# Patient Record
Sex: Female | Born: 1995 | Race: White | Hispanic: No | Marital: Married | Smoking: Never smoker
Health system: Southern US, Community
[De-identification: ages and names within clinical notes are randomized; demographics above are authoritative.]

## PROBLEM LIST (undated history)

## (undated) DIAGNOSIS — F329 Major depressive disorder, single episode, unspecified: Secondary | ICD-10-CM

## (undated) DIAGNOSIS — F32A Depression, unspecified: Secondary | ICD-10-CM

## (undated) DIAGNOSIS — F419 Anxiety disorder, unspecified: Secondary | ICD-10-CM

## (undated) DIAGNOSIS — T7840XA Allergy, unspecified, initial encounter: Secondary | ICD-10-CM

## (undated) HISTORY — DX: Allergy, unspecified, initial encounter: T78.40XA

## (undated) HISTORY — DX: Depression, unspecified: F32.A

## (undated) HISTORY — DX: Anxiety disorder, unspecified: F41.9

## (undated) HISTORY — PX: NO PAST SURGERIES: SHX2092

---
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
## 2018-08-15 ENCOUNTER — Ambulatory Visit: Payer: Self-pay | Admitting: Adult Health

## 2018-08-15 ENCOUNTER — Encounter: Payer: Self-pay | Admitting: Adult Health

## 2018-08-15 VITALS — BP 131/87 | HR 71 | Temp 98.5°F | Resp 16 | Ht 66.0 in | Wt 124.0 lb

## 2018-08-15 DIAGNOSIS — H00024 Hordeolum internum left upper eyelid: Secondary | ICD-10-CM

## 2018-08-15 DIAGNOSIS — L819 Disorder of pigmentation, unspecified: Secondary | ICD-10-CM

## 2018-08-15 DIAGNOSIS — H0014 Chalazion left upper eyelid: Secondary | ICD-10-CM

## 2018-08-15 MED ORDER — ERYTHROMYCIN 5 MG/GM OP OINT: 1.0000 "application " | TOPICAL_OINTMENT | Freq: Three times a day (TID) | OPHTHALMIC | 0 refills | Status: AC

## 2018-08-15 NOTE — Patient Instructions (Addendum)
Stye A stye is a bump on your eyelid caused by a bacterial infection. A stye can form inside the eyelid (internal stye) or outside the eyelid (external stye). An internal stye may be caused by an infected oil-producing gland inside your eyelid. An external stye may be caused by an infection at the base of your eyelash (hair follicle). Styes are very common. Anyone can get them at any age. They usually occur in just one eye, but you may have more than one in either eye. What are the causes? The infection is almost always caused by bacteria called Staphylococcus aureus. This is a common type of bacteria that lives on your skin. What increases the risk? You may be at higher risk for a stye if you have had one before. You may also be at higher risk if you have:  Diabetes.  Long-term illness.  Long-term eye redness.  A skin condition called seborrhea.  High fat levels in your blood (lipids).  What are the signs or symptoms? Eyelid pain is the most common symptom of a stye. Internal styes are more painful than external styes. Other signs and symptoms may include:  Painful swelling of your eyelid.  A scratchy feeling in your eye.  Tearing and redness of your eye.  Pus draining from the stye.  How is this diagnosed? Your health care provider may be able to diagnose a stye just by examining your eye. The health care provider may also check to make sure:  You do not have a fever or other signs of a more serious infection.  The infection has not spread to other parts of your eye or areas around your eye.  How is this treated? Most styes will clear up in a few days without treatment. In some cases, you may need to use antibiotic drops or ointment to prevent infection. Your health care provider may have to drain the stye surgically if your stye is:  Large.  Causing a lot of pain.  Interfering with your vision.  This can be done using a thin blade or a needle. Follow these  instructions at home:  Take medicines only as directed by your health care provider.  Apply a clean, warm compress to your eye for 10 minutes, 4 times a day.  Do not wear contact lenses or eye makeup until your stye has healed.  Do not try to pop or drain the stye. Contact a health care provider if:  You have chills or a fever.  Your stye does not go away after several days.  Your stye affects your vision.  Your eyeball becomes swollen, red, or painful. This information is not intended to replace advice given to you by your health care provider. Make sure you discuss any questions you have with your health care provider. Document Released: 08/01/2005 Document Revised: 06/17/2016 Document Reviewed: 02/05/2014 Elsevier Interactive Patient Education  2018 [employer]. Chalazion A chalazion is a swelling or lump on the eyelid. It can affect the upper or lower eyelid. What are the causes? This condition may be caused by:  Long-lasting (chronic) inflammation of the eyelid glands.  A blocked oil gland in the eyelid.  What are the signs or symptoms? Symptoms of this condition include:  A swelling on the eyelid. The swelling may spread to areas around the eye.  A hard lump on the eyelid. This lump may make it hard to see out of the eye.  How is this diagnosed? This condition is diagnosed with an  examination of the eye. How is this treated? This condition is treated by applying a warm compress to the eyelid. If the condition does not improve after two days, it may be treated with:  Surgery.  Medicine that is injected into the chalazion by a health care provider.  Medicine that is applied to the eye.  Follow these instructions at home:  Do not touch the chalazion.  Do not try to remove the pus, such as by squeezing the chalazion or sticking it with a pin or needle.  Do not rub your eyes.  Wash your hands often. Dry your hands with a clean towel.  Keep your face,  scalp, and eyebrows clean.  Avoid wearing eye makeup.  Apply a warm, moist compress to the eyelid 4-6 times a day for 10-15 minutes at a time. This will help to open any blocked glands and help to reduce redness and swelling.  Apply over-the-counter and prescription medicines only as told by your health care provider.  If the chalazion does not break open (rupture) on its own in a month, return to your health care provider.  Keep all follow-up appointments as told by your health care provider. This is important. Contact a health care provider if:  Your eyelid has not improved in 4 weeks.  Your eyelid is getting worse.  You have a fever.  The chalazion does not rupture on its own with home treatment in a month. Get help right away if:  You have pain in your eye.  Your vision changes.  The chalazion becomes painful or red  The chalazion gets bigger. This information is not intended to replace advice given to you by your health care provider. Make sure you discuss any questions you have with your health care provider. Document Released: 10/19/2000 Document Revised: 03/29/2016 Document Reviewed: 02/14/2015 Elsevier Interactive Patient Education  2018 [employer] Stye A stye is a bump on your eyelid caused by a bacterial infection. A stye can form inside the eyelid (internal stye) or outside the eyelid (external stye). An internal stye may be caused by an infected oil-producing gland inside your eyelid. An external stye may be caused by an infection at the base of your eyelash (hair follicle). Styes are very common. Anyone can get them at any age. They usually occur in just one eye, but you may have more than one in either eye. What are the causes? The infection is almost always caused by bacteria called Staphylococcus aureus. This is a common type of bacteria that lives on your skin. What increases the risk? You may be at higher risk for a stye if you have had one before. You may  also be at higher risk if you have:  Diabetes.  Long-term illness.  Long-term eye redness.  A skin condition called seborrhea.  High fat levels in your blood (lipids).  What are the signs or symptoms? Eyelid pain is the most common symptom of a stye. Internal styes are more painful than external styes. Other signs and symptoms may include:  Painful swelling of your eyelid.  A scratchy feeling in your eye.  Tearing and redness of your eye.  Pus draining from the stye.  How is this diagnosed? Your health care provider may be able to diagnose a stye just by examining your eye. The health care provider may also check to make sure:  You do not have a fever or other signs of a more serious infection.  The infection has not spread to  other parts of your eye or areas around your eye.  How is this treated? Most styes will clear up in a few days without treatment. In some cases, you may need to use antibiotic drops or ointment to prevent infection. Your health care provider may have to drain the stye surgically if your stye is:  Large.  Causing a lot of pain.  Interfering with your vision.  This can be done using a thin blade or a needle. Follow these instructions at home:  Take medicines only as directed by your health care provider.  Apply a clean, warm compress to your eye for 10 minutes, 4 times a day.  Do not wear contact lenses or eye makeup until your stye has healed.  Do not try to pop or drain the stye. Contact a health care provider if:  You have chills or a fever.  Your stye does not go away after several days.  Your stye affects your vision.  Your eyeball becomes swollen, red, or painful. This information is not intended to replace advice given to you by your health care provider. Make sure you discuss any questions you have with your health care provider. Document Released: 08/01/2005 Document Revised: 06/17/2016 Document Reviewed: 02/05/2014 Elsevier  Interactive Patient Education  [employer].

## 2018-08-15 NOTE — Progress Notes (Addendum)
Subjective:     Patient ID: Heidi Gonzales, female   DOB: Feb 27, 1996, 22 y.o.   MRN: [id number]  HPI   Blood pressure 131/87, pulse 71, temperature 98.5 F (36.9 C), resp. rate 16, height 5\' 6"  (1.676 m), weight 124 lb (56.2 kg), SpO2 100 %. Patient is a 22 year old female in no acute distress who comes to the clinic for upper eye lid swelling. Onset today  per patient  Denies any eye trauma.  No irritants. Denies any vision changes.   Patient has skin lesion on right forehead " mole"  That she reports has gotten larger in size over the past two - three months. Denies any bleeding or crusting.   Denies any vision changes.  Denies eye pain.  Patient  denies any fever, body aches,chills, rash, chest pain, shortness of breath, nausea, vomiting, or diarrhea.   LMP - last week per patient  Allergies  Allergen Reactions  . Pollen Extract Itching    Review of Systems  Constitutional: Negative.   HENT: Negative.   Eyes: Negative for photophobia, pain, discharge, redness, itching and visual disturbance.       Left eye lid swelling/ redness   Respiratory: Negative.   Cardiovascular: Negative.   Gastrointestinal: Negative.   Musculoskeletal: Negative.   Skin: Positive for color change. Negative for pallor, rash and wound.       Skin lesion right forehead/ side   Neurological: Negative.   Psychiatric/Behavioral: Negative.        Objective:   Physical Exam  Constitutional: She appears well-developed and well-nourished. No distress.  HENT:  Head: Normocephalic and atraumatic.    Mouth/Throat: No oropharyngeal exudate.  Skin colored lesion left forehead approximately 0.5cm x 0.53m raised with darker colored center.  Skin intact no drainage or erythema.   Eyes: Pupils are equal, round, and reactive to light. Conjunctivae and EOM are normal. Right eye exhibits no discharge and no hordeolum. Left eye exhibits hordeolum. Left eye exhibits no discharge. No scleral icterus.    Left  eyelid upper with small pinpoint sized internal stye as well as mild swelling to left upper eyelid nasal side.  No surrounding cellulitis, erythema or discharge. No change in skin temperature.   Neck: Normal range of motion. Neck supple.  Cardiovascular: Normal rate, regular rhythm, normal heart sounds and intact distal pulses.  Pulmonary/Chest: Effort normal and breath sounds normal.  Musculoskeletal: Normal range of motion.  Skin: Skin is warm, dry and intact. Capillary refill takes less than 2 seconds. She is not diaphoretic.  Psychiatric: She has a normal mood and affect. Her behavior is normal. Judgment and thought content normal.  Vitals reviewed.      Assessment:    Chalazion left upper eyelid  Change in pigmented skin lesion of face - Plan: Ambulatory referral to Dermatology  Hordeolum internum of left upper eyelid      Plan:    Medication would not E-prescribe - error given for demographics to long.  Erythromycin ointment 0.5 %.Apply one cm ribbon to left eye lid internally three times daily for 10 days. 3.5gram tube. No refills.  Called above prescription to [facility] s/w  Massie Maroon D. With above prescription - only comes in    Reviewed after visit summary and warm compressed  To eye lid TID.   Will refer to dermatology for follow up on right forehead skin lesion rule out benign lesion versus basal cell or other etiology given changing lesion. Patient advised to call the  office if she has not received call from dermatology by 1 week for appointment from today's date.   Orders Placed This Encounter  Procedures  . Ambulatory referral to Dermatology    Referral Priority:   Urgent    Referral Type:   Consultation    Referral Reason:   Specialty Services Required    Referred to Provider:   Deirdre Evener, MD    Requested Specialty:   Dermatology    Number of Visits Requested:   1   Provider also recommends patient see primary care physician for a routine  physical and to establish primary care. Patient may chose provider of choice. Also gave the [facility]  PHYSICIAN REFERRAL LINE at [phone] or web site at [email or web address] to help assist with finding a primary care doctor. Patient understands this office is acute care and no longer taking new primary care patients.   Advised patient call the office or your primary care doctor for an appointment if no improvement within 72 hours or if any symptoms change or worsen at any time  Advised ER or urgent Care if after hours or on weekend. Call 911 for emergency symptoms at any time.Patinet verbalized understanding of all instructions given/reviewed and treatment plan and has no further questions or concerns at this time.    Patient verbalized understanding of all instructions given and denies any further questions at this time.

## 2019-01-21 ENCOUNTER — Other Ambulatory Visit: Payer: Self-pay

## 2019-01-21 ENCOUNTER — Ambulatory Visit: Payer: Self-pay | Admitting: Medical

## 2019-01-21 ENCOUNTER — Encounter: Payer: Self-pay | Admitting: Medical

## 2019-01-21 VITALS — BP 131/88 | HR 84 | Temp 98.9°F | Resp 16 | Wt 129.4 lb

## 2019-01-21 DIAGNOSIS — R300 Dysuria: Secondary | ICD-10-CM

## 2019-01-21 LAB — POCT URINALYSIS DIPSTICK
BILIRUBIN UA: NEGATIVE
GLUCOSE UA: NEGATIVE
KETONES UA: NEGATIVE
Leukocytes, UA: NEGATIVE
Nitrite, UA: NEGATIVE
PH UA: 7.5 (ref 5.0–8.0)
Protein, UA: NEGATIVE
Spec Grav, UA: <=1.005 — AB (ref 1.010–1.025)
UROBILINOGEN UA: 0.2 U/dL

## 2019-01-21 NOTE — Progress Notes (Signed)
   Subjective:    Patient ID: Heidi Gonzales, female    DOB: 1996-09-09, 23 y.o.   MRN: [id number]  HPI  23 yo female in non acute distress. Started Sunday with pink urine did eat beets. Monday with some pain at the end of urinating. Denies fever or chills.  Feel like she is having period cramping , started her period today. No back or abdominal pain. Symptoms have improved.   Moved here in June does not have a family doctor.   No prior urinary tract infections before.     Review of Systems  Constitutional: Negative for chills and fever.  Gastrointestinal: Positive for abdominal pain (lower cramping on mensis).  Genitourinary: Positive for dysuria (symptoms feel better today) and hematuria (possibly). Negative for decreased urine volume, difficulty urinating, flank pain, frequency and urgency.  Musculoskeletal: Negative for back pain.  Skin: Negative for rash.   Patient not concerned about STDs she is married only has had one partner.    Objective:   Physical Exam Vitals signs and nursing note reviewed.  Constitutional:      Appearance: Normal appearance. She is normal weight.  HENT:     Head: Normocephalic and atraumatic.  Eyes:     Extraocular Movements: Extraocular movements intact.     Conjunctiva/sclera: Conjunctivae normal.     Pupils: Pupils are equal, round, and reactive to light.  Neck:     Musculoskeletal: Normal range of motion.  Cardiovascular:     Rate and Rhythm: Normal rate and regular rhythm.  Pulmonary:     Effort: Pulmonary effort is normal.     Breath sounds: Normal breath sounds.  Abdominal:     General: Abdomen is flat. Bowel sounds are normal. There is no distension.     Palpations: Abdomen is soft. There is no mass.     Tenderness: There is no abdominal tenderness. There is no right CVA tenderness, left CVA tenderness, guarding or rebound.     Hernia: No hernia is present.  Musculoskeletal: Normal range of motion.  Skin:    General: Skin is warm  and dry.  Neurological:     General: No focal deficit present.     Mental Status: She is alert and oriented to person, place, and time.  Psychiatric:        Mood and Affect: Mood normal.        Behavior: Behavior normal.        Thought Content: Thought content normal.        Judgment: Judgment normal.    Urine dipstick shows  Urinalysis color light yellow, no visible blood.    Component Value Date/Time   BILIRUBINUR neg 01/21/2019 1055   PROTEINUR Negative 01/21/2019 1055   UROBILINOGEN 0.2 01/21/2019 1055   NITRITE neg 01/21/2019 1055   LEUKOCYTESUR Negative 01/21/2019 1055   Sent off for culture        Assessment & Plan:  Dysuria , improving per patient  Orders Placed This Encounter  Procedures  . Urine Culture  . POCT urinalysis dipstick   Call clinic is symptoms return or worsen. I will only call patient if her urine is abnormal. No antibiotics were prescribed to day. If patient calls with symptoms, I would place her on some antibiotics.  Given information on [facility] physician referral line to establish a PCP. Patient verbalizes understanding and had no questions at discharge.

## 2019-01-22 ENCOUNTER — Encounter: Payer: Self-pay | Admitting: Medical

## 2019-01-23 LAB — URINE CULTURE: Organism ID, Bacteria: NO GROWTH

## 2019-01-26 ENCOUNTER — Encounter: Payer: Self-pay | Admitting: Medical

## 2019-01-26 ENCOUNTER — Other Ambulatory Visit: Payer: Self-pay | Admitting: Medical

## 2019-01-26 DIAGNOSIS — R300 Dysuria: Secondary | ICD-10-CM

## 2019-01-26 MED ORDER — CIPROFLOXACIN HCL 500 MG PO TABS: 500.0000 mg | ORAL_TABLET | Freq: Two times a day (BID) | ORAL | 0 refills | Status: DC

## 2019-01-26 NOTE — Progress Notes (Unsigned)
See MyChart note.

## 2019-03-11 ENCOUNTER — Encounter: Payer: Self-pay | Admitting: Medical

## 2019-08-26 ENCOUNTER — Other Ambulatory Visit: Payer: Self-pay

## 2019-08-26 ENCOUNTER — Ambulatory Visit: Payer: Self-pay

## 2019-08-26 DIAGNOSIS — Z23 Encounter for immunization: Secondary | ICD-10-CM

## 2019-10-12 ENCOUNTER — Encounter: Payer: Self-pay | Admitting: *Deleted

## 2019-11-12 ENCOUNTER — Encounter: Payer: Self-pay | Admitting: Family Medicine

## 2019-11-12 ENCOUNTER — Ambulatory Visit (INDEPENDENT_AMBULATORY_CARE_PROVIDER_SITE_OTHER): Payer: BC Managed Care – PPO | Admitting: Family Medicine

## 2019-11-12 ENCOUNTER — Other Ambulatory Visit: Payer: Self-pay

## 2019-11-12 DIAGNOSIS — G43009 Migraine without aura, not intractable, without status migrainosus: Secondary | ICD-10-CM

## 2019-11-12 DIAGNOSIS — F419 Anxiety disorder, unspecified: Secondary | ICD-10-CM

## 2019-11-12 DIAGNOSIS — Z5181 Encounter for therapeutic drug level monitoring: Secondary | ICD-10-CM | POA: Diagnosis not present

## 2019-11-12 DIAGNOSIS — K5900 Constipation, unspecified: Secondary | ICD-10-CM | POA: Insufficient documentation

## 2019-11-12 MED ORDER — AMITRIPTYLINE HCL 10 MG PO TABS: 10.0000 mg | ORAL_TABLET | Freq: Every day | ORAL | 1 refills | Status: DC

## 2019-11-12 NOTE — Patient Instructions (Addendum)
Low-FODMAP Eating Plan  FODMAPs (fermentable oligosaccharides, disaccharides, monosaccharides, and polyols) are sugars that are hard for some people to digest. A low-FODMAP eating plan may help some people who have bowel (intestinal) diseases to manage their symptoms. This meal plan can be complicated to follow. Work with a diet and nutrition specialist (dietitian) to make a low-FODMAP eating plan that is right for you. A dietitian can make sure that you get enough nutrition from this diet. What are tips for following this plan? Reading food labels  Check labels for hidden FODMAPs such as: ? High-fructose syrup. ? Honey. ? Agave. ? Natural fruit flavors. ? Onion or garlic powder.  Choose low-FODMAP foods that contain 3-4 grams of fiber per serving.  Check food labels for serving sizes. Eat only one serving at a time to make sure FODMAP levels stay low. Meal planning  Follow a low-FODMAP eating plan for up to 6 weeks, or as told by your health care provider or dietitian.  To follow the eating plan: 1. Eliminate high-FODMAP foods from your diet completely. 2. Gradually reintroduce high-FODMAP foods into your diet one at a time. Most people should wait a few days after introducing one high-FODMAP food before they introduce the next high-FODMAP food. Your dietitian can recommend how quickly you may reintroduce foods. 3. Keep a daily record of what you eat and drink, and make note of any symptoms that you have after eating. 4. Review your daily record with a dietitian regularly. Your dietitian can help you identify which foods you can eat and which foods you should avoid. General tips  Drink enough fluid each day to keep your urine pale yellow.  Avoid processed foods. These often have added sugar and may be high in FODMAPs.  Avoid most dairy products, whole grains, and sweeteners.  Work with a dietitian to make sure you get enough fiber in your diet. Recommended  foods Grains  Gluten-free grains, such as rice, oats, buckwheat, quinoa, corn, polenta, and millet. Gluten-free pasta, bread, or cereal. Rice noodles. Corn tortillas. Vegetables  Eggplant, zucchini, cucumber, peppers, green beans, Brussels sprouts, bean sprouts, lettuce, arugula, kale, Swiss chard, spinach, collard greens, bok choy, summer squash, potato, and tomato. Limited amounts of corn, carrot, and sweet potato. Green parts of scallions. Fruits  Bananas, oranges, lemons, limes, blueberries, raspberries, strawberries, grapes, cantaloupe, honeydew melon, kiwi, papaya, passion fruit, and pineapple. Limited amounts of dried cranberries, banana chips, and shredded coconut. Dairy  Lactose-free milk, yogurt, and kefir. Lactose-free cottage cheese and ice cream. Non-dairy milks, such as almond, coconut, hemp, and rice milk. Yogurts made of non-dairy milks. Limited amounts of goat cheese, brie, mozzarella, parmesan, swiss, and other hard cheeses. Meats and other protein foods  Unseasoned beef, pork, poultry, or fish. Eggs. Berniece Salines. Tofu (firm) and tempeh. Limited amounts of nuts and seeds, such as almonds, walnuts, [place] nuts, pecans, peanuts, pumpkin seeds, chia seeds, and sunflower seeds. Fats and oils  Butter-free spreads. Vegetable oils, such as olive, canola, and sunflower oil. Seasoning and other foods  Artificial sweeteners with names that do not end in "ol" such as aspartame, saccharine, and stevia. Maple syrup, white table sugar, raw sugar, brown sugar, and molasses. Fresh basil, coriander, parsley, rosemary, and thyme. Beverages  Water and mineral water. Sugar-sweetened soft drinks. Small amounts of orange juice or cranberry juice. Black and green tea. Most dry wines. Coffee. This may not be a complete list of low-FODMAP foods. Talk with your dietitian for more information. Foods to avoid Grains  Wheat, including kamut, durum, and semolina. Barley and bulgur. Couscous. Wheat-based  cereals. Wheat noodles, bread, crackers, and pastries. Vegetables  Chicory root, artichoke, asparagus, cabbage, snow peas, sugar snap peas, mushrooms, and cauliflower. Onions, garlic, leeks, and the white part of scallions. Fruits  Fresh, dried, and juiced forms of apple, pear, watermelon, peach, plum, cherries, apricots, blackberries, boysenberries, figs, nectarines, and mango. Avocado. Dairy  Milk, yogurt, ice cream, and soft cheese. Cream and sour cream. Milk-based sauces. Custard. Meats and other protein foods  Fried or fatty meat. Sausage. Cashews and pistachios. Soybeans, baked beans, black beans, chickpeas, kidney beans, fava beans, navy beans, lentils, and split peas. Seasoning and other foods  Any sugar-free gum or candy. Foods that contain artificial sweeteners such as sorbitol, mannitol, isomalt, or xylitol. Foods that contain honey, high-fructose corn syrup, or agave. Bouillon, vegetable stock, beef stock, and chicken stock. Garlic and onion powder. Condiments made with onion, such as hummus, chutney, pickles, relish, salad dressing, and salsa. Tomato paste. Beverages  Chicory-based drinks. Coffee substitutes. Chamomile tea. Fennel tea. Sweet or fortified wines such as port or sherry. Diet soft drinks made with isomalt, mannitol, maltitol, sorbitol, or xylitol. Apple, pear, and mango juice. Juices with high-fructose corn syrup. This may not be a complete list of high-FODMAP foods. Talk with your dietitian to discuss what dietary choices are best for you.  Summary  A low-FODMAP eating plan is a short-term diet that eliminates FODMAPs from your diet to help ease symptoms of certain bowel diseases.  The eating plan usually lasts up to 6 weeks. After that, high-FODMAP foods are restarted gradually, one at a time, so you can find out which may be causing symptoms.  A low-FODMAP eating plan can be complicated. It is best to work with a dietitian who has experience with this type of  plan. This information is not intended to replace advice given to you by your health care provider. Make sure you discuss any questions you have with your health care provider. Document Revised: 10/04/2017 Document Reviewed: 06/18/2017 Elsevier Patient Education  2020 Elsevier Inc.   Amitriptyline tablets What is this medicine? AMITRIPTYLINE (a mee TRIP ti leen) is used to treat depression. This medicine may be used for other purposes; ask your health care provider or pharmacist if you have questions. COMMON BRAND NAME(S): Elavil, Vanatrip What should I tell my health care provider before I take this medicine? They need to know if you have any of these conditions:  an alcohol problem  asthma, difficulty breathing  bipolar disorder or schizophrenia  difficulty passing urine, prostate trouble  glaucoma  heart disease or previous heart attack  liver disease  over active thyroid  seizures  thoughts or plans of suicide, a previous suicide attempt, or family history of suicide attempt  an unusual or allergic reaction to amitriptyline, other medicines, foods, dyes, or preservatives  pregnant or trying to get pregnant  breast-feeding How should I use this medicine? Take this medicine by mouth with a drink of water. Follow the directions on the prescription label. You can take the tablets with or without food. Take your medicine at regular intervals. Do not take it more often than directed. Do not stop taking this medicine suddenly except upon the advice of your doctor. Stopping this medicine too quickly may cause serious side effects or your condition may worsen. A special MedGuide will be given to you by the pharmacist with each prescription and refill. Be sure to read this information carefully each time. Talk  to your pediatrician regarding the use of this medicine in children. Special care may be needed. Overdosage: If you think you have taken too much of this medicine contact  a poison control center or emergency room at once. NOTE: This medicine is only for you. Do not share this medicine with others. What if I miss a dose? If you miss a dose, take it as soon as you can. If it is almost time for your next dose, take only that dose. Do not take double or extra doses. What may interact with this medicine? Do not take this medicine with any of the following medications:  arsenic trioxide  certain medicines used to regulate abnormal heartbeat or to treat other heart conditions  cisapride  droperidol  halofantrine  linezolid  MAOIs like Carbex, Eldepryl, Marplan, Nardil, and Parnate  methylene blue  other medicines for mental depression  phenothiazines like perphenazine, thioridazine and chlorpromazine  pimozide  probucol  procarbazine  sparfloxacin  St. John's Wort This medicine may also interact with the following medications:  atropine and related drugs like hyoscyamine, scopolamine, tolterodine and others  barbiturate medicines for inducing sleep or treating seizures, like phenobarbital  cimetidine  disulfiram  ethchlorvynol  thyroid hormones such as levothyroxine  ziprasidone This list may not describe all possible interactions. Give your health care provider a list of all the medicines, herbs, non-prescription drugs, or dietary supplements you use. Also tell them if you smoke, drink alcohol, or use illegal drugs. Some items may interact with your medicine. What should I watch for while using this medicine? Tell your doctor if your symptoms do not get better or if they get worse. Visit your doctor or health care professional for regular checks on your progress. Because it may take several weeks to see the full effects of this medicine, it is important to continue your treatment as prescribed by your doctor. Patients and their families should watch out for new or worsening thoughts of suicide or depression. Also watch out for sudden  changes in feelings such as feeling anxious, agitated, panicky, irritable, hostile, aggressive, impulsive, severely restless, overly excited and hyperactive, or not being able to sleep. If this happens, especially at the beginning of treatment or after a change in dose, call your health care professional. Bonita Quin may get drowsy or dizzy. Do not drive, use machinery, or do anything that needs mental alertness until you know how this medicine affects you. Do not stand or sit up quickly, especially if you are an older patient. This reduces the risk of dizzy or fainting spells. Alcohol may interfere with the effect of this medicine. Avoid alcoholic drinks. Do not treat yourself for coughs, colds, or allergies without asking your doctor or health care professional for advice. Some ingredients can increase possible side effects. Your mouth may get dry. Chewing sugarless gum or sucking hard candy, and drinking plenty of water will help. Contact your doctor if the problem does not go away or is severe. This medicine may cause dry eyes and blurred vision. If you wear contact lenses you may feel some discomfort. Lubricating drops may help. See your eye doctor if the problem does not go away or is severe. This medicine can cause constipation. Try to have a bowel movement at least every 2 to 3 days. If you do not have a bowel movement for 3 days, call your doctor or health care professional. This medicine can make you more sensitive to the sun. Keep out of the sun. If you  cannot avoid being in the sun, wear protective clothing and use sunscreen. Do not use sun lamps or tanning beds/booths. What side effects may I notice from receiving this medicine? Side effects that you should report to your doctor or health care professional as soon as possible:  allergic reactions like skin rash, itching or hives, swelling of the face, lips, or tongue  anxious  breathing problems  changes in vision  confusion  elevated mood,  decreased need for sleep, racing thoughts, impulsive behavior  eye pain  fast, irregular heartbeat  feeling faint or lightheaded, falls  feeling agitated, angry, or irritable  fever with increased sweating  hallucination, loss of contact with reality  seizures  stiff muscles  suicidal thoughts or other mood changes  tingling, pain, or numbness in the feet or hands  trouble passing urine or change in the amount of urine  trouble sleeping  unusually weak or tired  vomiting  yellowing of the eyes or skin Side effects that usually do not require medical attention (report to your doctor or health care professional if they continue or are bothersome):  change in sex drive or performance  change in appetite or weight  constipation  dizziness  dry mouth  nausea  tired  tremors  upset stomach This list may not describe all possible side effects. Call your doctor for medical advice about side effects. You may report side effects to FDA at 1-800-FDA-1088. Where should I keep my medicine? Keep out of the reach of children. Store at room temperature between 20 and 25 degrees C (68 and 77 degrees F). Throw away any unused medicine after the expiration date. NOTE: This sheet is a summary. It may not cover all possible information. If you have questions about this medicine, talk to your doctor, pharmacist, or health care provider.  2020 Elsevier/Gold Standard (2018-10-14 13:04:32)

## 2019-11-12 NOTE — Progress Notes (Signed)
Name: Heidi Gonzales   MRN: [id number]    DOB: 04-02-1996   Date:11/12/2019       Progress Note  Subjective  Chief Complaint  Chief Complaint  Patient presents with  . Establish Care  . Constipation    bloating  . Migraine    at least 3 a month    I connected with  Heidi Gonzales  on 11/12/19 at 11:00 AM EST by a video enabled telemedicine application and verified that I am speaking with the correct person using two identifiers.  I discussed the limitations of evaluation and management by telemedicine and the availability of in person appointments. The patient expressed understanding and agreed to proceed. Staff also discussed with the patient that there may be a patient responsible charge related to this service. Patient Location: Home Provider Location: Office Additional Individuals present: None  HPI  Pt presents to establish care.  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  Also pursuing Chaplaincy in the future.   Constipation/Bloating: She has had issues with constipation for about 2 years.  She thought is was an issue of anxiety and stress.  She exercises daily and has changed her diet without relief.  She notes that avoiding dairy and gluten works to decrease bloating and acne, but constipation is still significant.  She will often go 10-12 days without a bowel movement.  Does have occasional BRBPR, no dark and tarry stools.  Will have occasional nausea when she is very constipated.  She has taken laxative about once a month for the last several month.  Discussed medication options (Miralax, amitiza, loinzess) - she does not want to take medication at this time, would like to see GI to discuss.  Also recommend Low FodMap diet to determine.   Headaches: She has been having more intense headaches every week since October 2020. She questions if headaches are related to stress/diet, etc.  Mother, brother, and sister with history migraines.  Having  about 8 migraine days in a month right now.   She endorses more anxiety and stress lately with her job.  Has never been medicated for anxiety in the past, but has gone to counseling in the past.  Seeing a counselor online routinely.  Typical Headache: Starts in temple/jaw unilaterally, then moves to the front of the head.  Will take ibuprofen and lay down.  Typically starts in the evening, will often wake up with headache the next morning.  She takes 200mg  ibuprofen and drinks some caffeine if needed which does tend to help with the symptoms.  She does this maybe once a week at most.  She describes her migraine episodes as having lack of focus, photosensitivity, sound sensitivity, and needing to lie down.    Patient Active Problem List   Diagnosis Date Noted  . Constipation 11/12/2019  . Migraine without aura and without status migrainosus, not intractable 11/12/2019  . Anxiety 11/12/2019    Past Surgical History:  Procedure Laterality Date  . NO PAST SURGERIES      Family History  Problem Relation Age of Onset  . Thyroid disease Mother   . Heart disease Mother   . Asthma Father   . Alcohol abuse Brother   . Drug abuse Brother     Social History   Socioeconomic History  . Marital status: Married    Spouse name: Heidi Gonzales  . Number of children: 0  . Years of education: Not on file  . Highest education level:  Not on file  Occupational History  . Occupation: Marketing executive: [employer]  Tobacco Use  . Smoking status: Never Smoker  . Smokeless tobacco: Never Used  Substance and Sexual Activity  . Alcohol use: Yes  . Drug use: Never  . Sexual activity: Yes    Birth control/protection: Pill  Other Topics Concern  . Not on file  Social History Narrative  . Not on file   Social Determinants of Health   Financial Resource Strain:   . Difficulty of Paying Living Expenses: Not on file  Food Insecurity:   . Worried About Programme researcher, broadcasting/film/video in the Last Year:  Not on file  . Ran Out of Food in the Last Year: Not on file  Transportation Needs:   . Lack of Transportation (Medical): Not on file  . Lack of Transportation (Non-Medical): Not on file  Physical Activity:   . Days of Exercise per Week: Not on file  . Minutes of Exercise per Session: Not on file  Stress:   . Feeling of Stress : Not on file  Social Connections:   . Frequency of Communication with Friends and Family: Not on file  . Frequency of Social Gatherings with Friends and Family: Not on file  . Attends Religious Services: Not on file  . Active Member of Clubs or Organizations: Not on file  . Attends Banker Meetings: Not on file  . Marital Status: Not on file  Intimate Partner Violence:   . Fear of Current or Ex-Partner: Not on file  . Emotionally Abused: Not on file  . Physically Abused: Not on file  . Sexually Abused: Not on file     Current Outpatient Medications:  .  Norgestimate-Ethinyl Estradiol Triphasic (TRI-LO-SPRINTEC) 0.18/0.215/0.25 MG-25 MCG tab, Take 1 tablet by mouth daily., Disp: , Rfl:   Allergies  Allergen Reactions  . Pollen Extract Itching    I personally reviewed active problem list, medication list, allergies, family history, social history, health maintenance, notes from last encounter, lab results with the patient/caregiver today.   ROS  Ten systems reviewed and is negative except as mentioned in HPI   Objective  Virtual encounter, vitals not obtained.  There is no height or weight on file to calculate BMI.  Physical Exam  Constitutional: Patient appears well-developed and well-nourished. No distress.  HENT: Head: Normocephalic and atraumatic.  Neck: Normal range of motion. Pulmonary/Chest: Effort normal. No respiratory distress. Speaking in complete sentences Neurological: Pt is alert and oriented to person, place, and time. Coordination, speech are normal.  Psychiatric: Patient has a normal mood and affect. behavior is  normal. Judgment and thought content normal.  No results found for this or any previous visit (from the past 72 hour(s)).  PHQ2/9: Depression screen PHQ 2/9 11/12/2019  Decreased Interest 0  Down, Depressed, Hopeless 0  PHQ - 2 Score 0  Altered sleeping 1  Tired, decreased energy 1  Change in appetite 0  Feeling bad or failure about yourself  0  Trouble concentrating 2  Moving slowly or fidgety/restless 0  Suicidal thoughts 0  PHQ-9 Score 4   PHQ-2/9 Result is positive.    Fall Risk: Fall Risk  11/12/2019  Number falls in past yr: 0  Injury with Fall? 0  Follow up Falls evaluation completed    Assessment & Plan  1. Constipation, unspecified constipation type - Low FODMAP diet discussed.  Does not want to take linzess/amitiza at this time. - Ambulatory referral  to Gastroenterology - amitriptyline (ELAVIL) 10 MG tablet; Take 1 tablet (10 mg total) by mouth at bedtime.  Dispense: 90 tablet; Refill: 1 - COMPLETE METABOLIC PANEL WITH GFR - CBC with Differential/Platelet - TSH  2. Migraine without aura and without status migrainosus, not intractable - amitriptyline (ELAVIL) 10 MG tablet; Take 1 tablet (10 mg total) by mouth at bedtime.  Dispense: 90 tablet; Refill: 1 - COMPLETE METABOLIC PANEL WITH GFR - CBC with Differential/Platelet - TSH - Magnesium  3. Anxiety Stress reduction discussed, she is attending counseling. - amitriptyline (ELAVIL) 10 MG tablet; Take 1 tablet (10 mg total) by mouth at bedtime.  Dispense: 90 tablet; Refill: 1 - COMPLETE METABOLIC PANEL WITH GFR - CBC with Differential/Platelet - TSH  4. Medication monitoring encounter - Pregnancy, urine  I discussed the assessment and treatment plan with the patient. The patient was provided an opportunity to ask questions and all were answered. The patient agreed with the plan and demonstrated an understanding of the instructions.  The patient was advised to call back or seek an in-person evaluation if the  symptoms worsen or if the condition fails to improve as anticipated.  I provided 38 minutes of non-face-to-face time during this encounter.

## 2019-11-30 ENCOUNTER — Other Ambulatory Visit: Payer: Self-pay

## 2019-11-30 ENCOUNTER — Ambulatory Visit: Payer: Self-pay | Admitting: Family Medicine

## 2019-12-03 DIAGNOSIS — Z13220 Encounter for screening for lipoid disorders: Secondary | ICD-10-CM | POA: Diagnosis not present

## 2019-12-03 DIAGNOSIS — Z124 Encounter for screening for malignant neoplasm of cervix: Secondary | ICD-10-CM | POA: Diagnosis not present

## 2019-12-03 DIAGNOSIS — Z131 Encounter for screening for diabetes mellitus: Secondary | ICD-10-CM | POA: Diagnosis not present

## 2019-12-03 DIAGNOSIS — R5383 Other fatigue: Secondary | ICD-10-CM | POA: Diagnosis not present

## 2019-12-03 DIAGNOSIS — E559 Vitamin D deficiency, unspecified: Secondary | ICD-10-CM | POA: Diagnosis not present

## 2019-12-03 DIAGNOSIS — Z01419 Encounter for gynecological examination (general) (routine) without abnormal findings: Secondary | ICD-10-CM | POA: Diagnosis not present

## 2019-12-03 LAB — HM PAP SMEAR: HM Pap smear: NORMAL

## 2019-12-22 ENCOUNTER — Ambulatory Visit (INDEPENDENT_AMBULATORY_CARE_PROVIDER_SITE_OTHER): Payer: BC Managed Care – PPO | Admitting: Family Medicine

## 2019-12-22 ENCOUNTER — Encounter: Payer: Self-pay | Admitting: Family Medicine

## 2019-12-22 DIAGNOSIS — K5900 Constipation, unspecified: Secondary | ICD-10-CM

## 2019-12-22 DIAGNOSIS — G43009 Migraine without aura, not intractable, without status migrainosus: Secondary | ICD-10-CM

## 2019-12-22 DIAGNOSIS — F419 Anxiety disorder, unspecified: Secondary | ICD-10-CM

## 2019-12-22 MED ORDER — AMITRIPTYLINE HCL 10 MG PO TABS: 20.0000 mg | ORAL_TABLET | Freq: Every day | ORAL | 1 refills | Status: DC

## 2019-12-22 NOTE — Progress Notes (Signed)
Name: Heidi Gonzales   MRN: [id number]    DOB: [birth year]   Date:12/22/2019       Progress Note  Subjective:    Chief Complaint  Chief Complaint  Patient presents with  . Migraine    started last week of December  . Constipation    going on for 6-8 months, has tried diet, exercise, removing dairy, laxatives on occasion    I connected with  Nawaal Alling  on 12/22/19 at 10:20 AM EST by a video enabled telemedicine application and verified that I am speaking with the correct person using two identifiers.  I discussed the limitations of evaluation and management by telemedicine and the availability of in person appointments. The patient expressed understanding and agreed to proceed. Staff also discussed with the patient that there may be a patient responsible charge related to this service. Patient Location: home Provider Location: [facility] clinic Additional Individuals present: none  HPI  Hx of constipation for years, recent visit with PCP about one month ago, here for f/up today Pt has worked on [employer] for the past 5 weeks has seems to help the gut and inflammation- bloating and cramping is better.  BMs still hard with some straining, stool is compact hard smaller volume, BMs are still slow, once every three days, some improvement from prior when she was only going every 5-7 d. She does have GI appt coming up first week of march. She has also tried amitriptyline which it has helped with migraines and her "high functioning anxiety". She would like to know if increasing the dose will help with any of her GI symptoms. GAD 7 : Generalized Anxiety Score 11/12/2019  Nervous, Anxious, on Edge 2  Control/stop worrying 0  Worry too much - different things 0  Trouble relaxing 1  Restless 0  Easily annoyed or irritable 0  Afraid - awful might happen 0  Total GAD 7 Score 3  Anxiety Difficulty Not difficult at all   Depression screen [facility] 2/9 12/22/2019 11/12/2019  Decreased Interest 0 0   Down, Depressed, Hopeless 0 0  PHQ - 2 Score 0 0  Altered sleeping 1 1  Tired, decreased energy 1 1  Change in appetite 0 0  Feeling bad or failure about yourself  0 0  Trouble concentrating 0 2  Moving slowly or fidgety/restless 0 0  Suicidal thoughts 0 0  PHQ-9 Score 2 4  Difficult doing work/chores Not difficult at all -   Patient PCP previously ordered blood work including CMP, CBC, thyroid/TSH/magnesium but they have not been done yet   Patient Active Problem List   Diagnosis Date Noted  . Constipation 11/12/2019  . Migraine without aura and without status migrainosus, not intractable 11/12/2019  . Anxiety 11/12/2019    Social History   Tobacco Use  . Smoking status: Never Smoker  . Smokeless tobacco: Never Used  Substance Use Topics  . Alcohol use: Yes     Current Outpatient Medications:  .  amitriptyline (ELAVIL) 10 MG tablet, Take 1 tablet (10 mg total) by mouth at bedtime., Disp: 90 tablet, Rfl: 1 .  Norgestimate-Ethinyl Estradiol Triphasic (TRI-LO-SPRINTEC) 0.18/0.215/0.25 MG-25 MCG tab, Take 1 tablet by mouth daily., Disp: , Rfl:   Allergies  Allergen Reactions  . Pollen Extract Itching   I personally reviewed active problem list, medication list, allergies, family history, social history, health maintenance, notes from last encounter, lab results, imaging with the patient/caregiver today.  Review of Systems  Constitutional: Negative.  HENT: Negative.   Eyes: Negative.   Respiratory: Negative.   Cardiovascular: Negative.   Gastrointestinal: Negative.   Endocrine: Negative.   Genitourinary: Negative.   Musculoskeletal: Negative.   Skin: Negative.   Allergic/Immunologic: Negative.   Neurological: Negative.   Hematological: Negative.   Psychiatric/Behavioral: Negative.   All other systems reviewed and are negative.     Objective:   Virtual encounter, vitals limited, only able to obtain the following Today's Vitals   12/22/19 0948  Temp:  98.7 F (37.1 C)  TempSrc: Temporal  Weight: 128 lb (58.1 kg)  Height: 5\' 6"  (1.676 m)   Body mass index is 20.66 kg/m. Nursing Note and Vital Signs reviewed.  Physical Exam Vitals and nursing note reviewed.  Constitutional:      General: She is not in acute distress.    Appearance: Normal appearance. She is well-developed. She is not ill-appearing, toxic-appearing or diaphoretic.  HENT:     Head: Normocephalic and atraumatic.  Eyes:     General:        Right eye: No discharge.        Left eye: No discharge.     Conjunctiva/sclera: Conjunctivae normal.  Neck:     Trachea: No tracheal deviation.  Cardiovascular:     Rate and Rhythm: Normal rate.  Pulmonary:     Effort: Pulmonary effort is normal. No respiratory distress.     Breath sounds: No stridor.  Skin:    Coloration: Skin is not jaundiced or pale.     Findings: No rash.  Neurological:     Mental Status: She is alert.  Psychiatric:        Mood and Affect: Mood normal.        Behavior: Behavior normal.     PE limited by telephone encounter  No results found for this or any previous visit (from the past 72 hour(s)).  Assessment and Plan:     ICD-10-CM   1. Constipation, unspecified constipation type  K59.00 amitriptyline (ELAVIL) 10 MG tablet  2. Migraine without aura and without status migrainosus, not intractable  G43.009 amitriptyline (ELAVIL) 10 MG tablet  3. Anxiety  F41.9 amitriptyline (ELAVIL) 10 MG tablet     Patient states that amitriptyline is working for her headaches and anxiety she would like to try increasing the dose to see if it helps at all with her GI symptoms.  Will increase to 20 mg at bedtime GI/constipation - continue with same diet per PCP, add miralax 1/2 cap daily, push fluids, has GI f/up in about 2-3 weeks  -Red flags and when to present for emergency care or RTC including fever >101.35F, chest pain, shortness of breath, new/worsening/un-resolving symptoms, reviewed with patient at  time of visit. Follow up and care instructions discussed and provided in AVS. - I discussed the assessment and treatment plan with the patient. The patient was provided an opportunity to ask questions and all were answered. The patient agreed with the plan and demonstrated an understanding of the instructions.  I provided 15 minutes of non-face-to-face time during this encounter.  , PA-C 12/22/19 11:05 AM

## 2020-01-05 ENCOUNTER — Other Ambulatory Visit: Payer: Self-pay

## 2020-01-05 ENCOUNTER — Ambulatory Visit: Payer: BC Managed Care – PPO | Admitting: Gastroenterology

## 2020-01-05 VITALS — BP 156/88 | HR 74 | Temp 98.2°F | Ht 66.0 in | Wt 131.0 lb

## 2020-01-05 DIAGNOSIS — K5900 Constipation, unspecified: Secondary | ICD-10-CM | POA: Diagnosis not present

## 2020-01-05 DIAGNOSIS — R194 Change in bowel habit: Secondary | ICD-10-CM

## 2020-01-05 NOTE — Progress Notes (Signed)
Wyline Mood MD, MRCP(U.K) [address]  [address]  [place], [place] [address]  Main: [phone]  Fax: [phone]   Gastroenterology Consultation  Referring Provider:     Doren Custard, FNP Primary Care Physician:  Doren Custard, FNP Primary Gastroenterologist:  Dr. Wyline Mood  Reason for Consultation:     Constipation        HPI:   Heidi Gonzales is a 24 y.o. y/o female referred for consultation & management  by. Doren Custard, FNP.   Constipation: Onset December all of a sudden Frequency of bowel movements every 3 to 4 days very hard Consistency very hard Shape round no new change Pain occasionally Bloating/abdominal distension yes Bleeding no Last colonoscopy none Weight loss none Family history of colon cancer none Diet rich in fiber Narcotic/anticholinergic medications none Laxative use MiraLAX half a capful a day and Amitiza 8 mcg a day Thyroid abnormalities yes in the family but none with herself and has not been checked   Past Medical History:  Diagnosis Date  . Allergy   . Anxiety   . Depression     Past Surgical History:  Procedure Laterality Date  . NO PAST SURGERIES      Prior to Admission medications   Medication Sig Start Date End Date Taking? Authorizing Provider  amitriptyline (ELAVIL) 10 MG tablet Take 2 tablets (20 mg total) by mouth at bedtime. 12/22/19   Danelle Berry, PA-C  Norgestimate-Ethinyl Estradiol Triphasic (TRI-LO-SPRINTEC) 0.18/0.215/0.25 MG-25 MCG tab Take 1 tablet by mouth daily.    [provider]    Family History  Problem Relation Age of Onset  . Thyroid disease Mother   . Heart disease Mother   . Asthma Father   . Alcohol abuse Brother   . Drug abuse Brother      Social History   Tobacco Use  . Smoking status: Never Smoker  . Smokeless tobacco: Never Used  Substance Use Topics  . Alcohol use: Yes  . Drug use: Never    Allergies as of 01/05/2020 - Review Complete 12/22/2019  Allergen  Reaction Noted  . Pollen extract Itching 08/15/2018    Review of Systems:    All systems reviewed and negative except where noted in HPI.   Physical Exam:  LMP 12/21/2019  Patient's last menstrual period was 12/21/2019. Psych:  Alert and cooperative. Normal mood and affect. General:   Alert,  Well-developed, well-nourished, pleasant and cooperative in NAD Head:  Normocephalic and atraumatic. Eyes:  Sclera clear, no icterus.   Conjunctiva pink. Lungs:  Respirations even and unlabored.  Clear throughout to auscultation.   No wheezes, crackles, or rhonchi. No acute distress. Heart:  Regular rate and rhythm; no murmurs, clicks, rubs, or gallops. Abdomen:  Normal bowel sounds.  No bruits.  Soft, non-tender and non-distended without masses, hepatosplenomegaly or hernias noted.  No guarding or rebound tenderness.    Neurologic:  Alert and oriented x3;  grossly normal neurologically. Psych:  Alert and cooperative. Normal mood and affect.  Imaging Studies: No results found.  Assessment and Plan:   Heidi Gonzales is a 24 y.o. y/o female has been referred for constipation new onset since December.  Family history of thyroid issues.  Plan 1.  High-fiber diet patient information provided 2.  Check TSH and celiac serology.  She has a lot of bloating very likely related to constipation and production of anything. 3.  Commence on MiraLAX 1 capful 2-3 times a day.  Eventually once her fiber  intake in her diet increases we will decrease the dose of her MiraLAX. 4.  Colonoscopy for recent onset change in bowel habits.  No clear precipitating cause.  I have discussed alternative options, risks & benefits,  which include, but are not limited to, bleeding, infection, perforation,respiratory complication & drug reaction.  The patient agrees with this plan & written consent will be obtained.     Follow up in 6 weeks telephone visit  Dr Jonathon Bellows MD,MRCP(U.K)

## 2020-01-05 NOTE — Patient Instructions (Signed)

## 2020-01-07 LAB — CELIAC DISEASE AB SCREEN W/RFX
Antigliadin Abs, IgA: 3 units (ref 0–19)
IgA/Immunoglobulin A, Serum: 119 mg/dL (ref 87–352)
Transglutaminase IgA: <2 U/mL (ref 0–3)

## 2020-01-07 LAB — TSH: TSH: 1.52 u[IU]/mL (ref 0.450–4.500)

## 2020-01-12 ENCOUNTER — Encounter: Payer: Self-pay | Admitting: Gastroenterology

## 2020-01-28 ENCOUNTER — Other Ambulatory Visit
Admission: RE | Admit: 2020-01-28 | Discharge: 2020-01-28 | Disposition: A | Discharge status: Home / Self Care | Payer: BC Managed Care – PPO | Source: Ambulatory Visit | Attending: Gastroenterology | Admitting: Gastroenterology

## 2020-01-28 DIAGNOSIS — Z01812 Encounter for preprocedural laboratory examination: Secondary | ICD-10-CM | POA: Diagnosis not present

## 2020-01-28 DIAGNOSIS — Z20822 Contact with and (suspected) exposure to covid-19: Secondary | ICD-10-CM | POA: Diagnosis not present

## 2020-01-29 ENCOUNTER — Encounter: Payer: Self-pay | Admitting: Gastroenterology

## 2020-01-29 LAB — SARS CORONAVIRUS 2 (TAT 6-24 HRS): SARS Coronavirus 2: NEGATIVE

## 2020-02-01 ENCOUNTER — Other Ambulatory Visit: Payer: Self-pay

## 2020-02-01 ENCOUNTER — Ambulatory Visit: Payer: BC Managed Care – PPO | Admitting: Certified Registered Nurse Anesthetist

## 2020-02-01 ENCOUNTER — Encounter
Admission: RE | Disposition: A | Discharge status: Home / Self Care | Payer: Self-pay | Source: Home / Self Care | Attending: Gastroenterology

## 2020-02-01 ENCOUNTER — Encounter: Payer: Self-pay | Admitting: Gastroenterology

## 2020-02-01 ENCOUNTER — Ambulatory Visit
Admission: RE | Admit: 2020-02-01 | Discharge: 2020-02-01 | Disposition: A | Discharge status: Home / Self Care | Payer: BC Managed Care – PPO | Attending: Gastroenterology | Admitting: Gastroenterology

## 2020-02-01 DIAGNOSIS — F419 Anxiety disorder, unspecified: Secondary | ICD-10-CM | POA: Insufficient documentation

## 2020-02-01 DIAGNOSIS — Z79899 Other long term (current) drug therapy: Secondary | ICD-10-CM | POA: Insufficient documentation

## 2020-02-01 DIAGNOSIS — R194 Change in bowel habit: Secondary | ICD-10-CM | POA: Insufficient documentation

## 2020-02-01 DIAGNOSIS — F329 Major depressive disorder, single episode, unspecified: Secondary | ICD-10-CM | POA: Diagnosis not present

## 2020-02-01 DIAGNOSIS — Z6821 Body mass index (BMI) 21.0-21.9, adult: Secondary | ICD-10-CM | POA: Insufficient documentation

## 2020-02-01 DIAGNOSIS — E669 Obesity, unspecified: Secondary | ICD-10-CM | POA: Diagnosis not present

## 2020-02-01 DIAGNOSIS — Z793 Long term (current) use of hormonal contraceptives: Secondary | ICD-10-CM | POA: Diagnosis not present

## 2020-02-01 DIAGNOSIS — F418 Other specified anxiety disorders: Secondary | ICD-10-CM | POA: Diagnosis not present

## 2020-02-01 HISTORY — PX: COLONOSCOPY WITH PROPOFOL: SHX5780

## 2020-02-01 LAB — POCT PREGNANCY, URINE: Preg Test, Ur: NEGATIVE

## 2020-02-01 SURGERY — COLONOSCOPY WITH PROPOFOL
Anesthesia: General

## 2020-02-01 MED ORDER — PROPOFOL 500 MG/50ML IV EMUL
INTRAVENOUS | Status: DC | PRN
  Administered 2020-02-01: 100 ug/kg/min via INTRAVENOUS

## 2020-02-01 MED ORDER — MIDAZOLAM HCL 2 MG/2ML IJ SOLN
INTRAMUSCULAR | Status: AC
  Filled 2020-02-01: qty 2

## 2020-02-01 MED ORDER — SODIUM CHLORIDE 0.9 % IV SOLN
INTRAVENOUS | Status: DC
  Administered 2020-02-01: 09:00:00 1000 mL via INTRAVENOUS

## 2020-02-01 MED ORDER — LIDOCAINE HCL (CARDIAC) PF 100 MG/5ML IV SOSY
PREFILLED_SYRINGE | INTRAVENOUS | Status: DC | PRN
  Administered 2020-02-01: 100 mg via INTRAVENOUS

## 2020-02-01 MED ORDER — PROPOFOL 10 MG/ML IV BOLUS
INTRAVENOUS | Status: DC | PRN
  Administered 2020-02-01: 40 mg via INTRAVENOUS
  Administered 2020-02-01: 100 mg via INTRAVENOUS
  Administered 2020-02-01: 40 mg via INTRAVENOUS

## 2020-02-01 NOTE — Op Note (Signed)
[facility] Gastroenterology Patient Name: Heidi Gonzales Procedure Date: 02/01/2020 9:44 AM MRN: [id number] Account #: [id number] Date of Birth: 10-30-1996 Admit Type: Outpatient Age: 24 Room: [facility] ENDO ROOM 4 Gender: Female Note Status: Finalized Procedure:             Colonoscopy Indications:           Change in bowel habits Providers:             Jonathon Bellows MD, MD Medicines:             Monitored Anesthesia Care Complications:         No immediate complications. Procedure:             Pre-Anesthesia Assessment:                        - Prior to the procedure, a History and Physical was                         performed, and patient medications, allergies and                         sensitivities were reviewed. The patient's tolerance                         of previous anesthesia was reviewed.                        - The risks and benefits of the procedure and the                         sedation options and risks were discussed with the                         patient. All questions were answered and informed                         consent was obtained.                        - ASA Grade Assessment: II - A patient with mild                         systemic disease.                        After obtaining informed consent, the colonoscope was                         passed under direct vision. Throughout the procedure,                         the patient's blood pressure, pulse, and oxygen                         saturations were monitored continuously. The                         Colonoscope was introduced through the anus and  advanced to the the cecum, identified by the                         appendiceal orifice. The colonoscopy was performed                         with ease. The patient tolerated the procedure well.                         The quality of the bowel preparation was                          unsatisfactory. Findings:      The perianal and digital rectal examinations were normal.      The entire examined colon appeared normal on direct and retroflexion       views. Impression:            - Preparation of the colon was unsatisfactory.                        - The entire examined colon is normal on direct and                         retroflexion views.                        - No specimens collected. Recommendation:        - Discharge patient to home (with escort).                        - Resume previous diet.                        - Continue present medications.                        - Return to my office PRN. Procedure Code(s):     --- Professional ---                        [phone], Colonoscopy, flexible; diagnostic, including                         collection of specimen(s) by brushing or washing, when                         performed (separate procedure) Diagnosis Code(s):     --- Professional ---                        R19.4, Change in bowel habit CPT copyright 2019 American Medical Association. All rights reserved. The codes documented in this report are preliminary and upon coder review may  be revised to meet current compliance requirements. Wyline Mood, MD Wyline Mood MD, MD 02/01/2020 10:05:12 AM This report has been signed electronically. Number of Addenda: 0 Note Initiated On: 02/01/2020 9:44 AM Scope Withdrawal Time: 0 hours 4 minutes 39 seconds  Total Procedure Duration: 0 hours 10 minutes 10 seconds  Estimated Blood Loss:  Estimated blood loss: none.      [facility]

## 2020-02-01 NOTE — H&P (Signed)
Jonathon Bellows, MD [address], [place], [place], [place], [address] [address], [place], [place], [place], [address] Phone: [phone]  Fax: [phone]  Primary Care Physician:  Hubbard Hartshorn, FNP   Pre-Procedure History & Physical: HPI:  Heidi Gonzales is a 24 y.o. female is here for an colonoscopy.   Past Medical History:  Diagnosis Date  . Allergy   . Anxiety   . Depression     Past Surgical History:  Procedure Laterality Date  . NO PAST SURGERIES      Prior to Admission medications   Medication Sig Start Date End Date Taking? Authorizing Provider  amitriptyline (ELAVIL) 10 MG tablet Take 2 tablets (20 mg total) by mouth at bedtime. 12/22/19  Yes Delsa Grana, PA-C  Norgestimate-Ethinyl Estradiol Triphasic (TRI-LO-SPRINTEC) 0.18/0.215/0.25 MG-25 MCG tab Take 1 tablet by mouth daily.   Yes [provider]    Allergies as of 01/06/2020 - Review Complete 01/05/2020  Allergen Reaction Noted  . Pollen extract Itching 08/15/2018    Family History  Problem Relation Age of Onset  . Thyroid disease Mother   . Heart disease Mother   . Asthma Father   . Alcohol abuse Brother   . Drug abuse Brother     Social History   Socioeconomic History  . Marital status: Married    Spouse name: daniel  . Number of children: 0  . Years of education: Not on file  . Highest education level: Not on file  Occupational History  . Occupation: Database administrator: [employer]  Tobacco Use  . Smoking status: Never Smoker  . Smokeless tobacco: Never Used  Substance and Sexual Activity  . Alcohol use: Yes  . Drug use: Never  . Sexual activity: Yes    Birth control/protection: Pill  Other Topics Concern  . Not on file  Social History Narrative  . Not on file   Social Determinants of Health   Financial Resource Strain:   . Difficulty of Paying Living Expenses:   Food Insecurity:   . Worried About Charity fundraiser in the Last Year:     . Arboriculturist in the Last Year:   Transportation Needs:   . Film/video editor (Medical):   Marland Kitchen Lack of Transportation (Non-Medical):   Physical Activity:   . Days of Exercise per Week:   . Minutes of Exercise per Session:   Stress:   . Feeling of Stress :   Social Connections:   . Frequency of Communication with Friends and Family:   . Frequency of Social Gatherings with Friends and Family:   . Attends Religious Services:   . Active Member of Clubs or Organizations:   . Attends Archivist Meetings:   Marland Kitchen Marital Status:   Intimate Partner Violence:   . Fear of Current or Ex-Partner:   . Emotionally Abused:   Marland Kitchen Physically Abused:   . Sexually Abused:     Review of Systems: See HPI, otherwise negative ROS  Physical Exam: LMP 01/18/2020 Comment: pregnancy negative General:   Alert,  pleasant and cooperative in NAD Head:  Normocephalic and atraumatic. Neck:  Supple; no masses or thyromegaly. Lungs:  Clear throughout to auscultation, normal respiratory effort.    Heart:  +S1, +S2, Regular rate and rhythm, No edema. Abdomen:  Soft, nontender and nondistended. Normal bowel sounds, without guarding, and without rebound.   Neurologic:  Alert and  oriented x4;  grossly normal neurologically.  Impression/Plan: Dee Paden is here for an colonoscopy to be performed for change in bowel habits.   Risks, benefits, limitations, and alternatives regarding  colonoscopy have been reviewed with the patient.  Questions have been answered.  All parties agreeable.   Wyline Mood, MD  02/01/2020, 9:11 AM

## 2020-02-01 NOTE — Transfer of Care (Signed)
Immediate Anesthesia Transfer of Care Note  Patient: Heidi Gonzales  Procedure(s) Performed: COLONOSCOPY WITH PROPOFOL (N/A )  Patient Location: PACU and Endoscopy Unit  Anesthesia Type:General  Level of Consciousness: awake, alert , oriented and patient cooperative  Airway & Oxygen Therapy: Patient Spontanous Breathing  Post-op Assessment: Report given to RN and Post -op Vital signs reviewed and stable  Post vital signs: Reviewed and stable  Last Vitals:  Vitals Value Taken Time  BP 125/89 02/01/20 1008  Temp    Pulse 116 02/01/20 1008  Resp 20 02/01/20 1008  SpO2 100 % 02/01/20 1008  Vitals shown include unvalidated device data.  Last Pain:  Vitals:   02/01/20 0911  TempSrc: Temporal  PainSc: 0-No pain         Complications: No apparent anesthesia complications

## 2020-02-01 NOTE — Anesthesia Preprocedure Evaluation (Signed)
Anesthesia Evaluation  Patient identified by MRN, date of birth, ID band Patient awake    Reviewed: Allergy & Precautions, NPO status , Patient's Chart, lab work & pertinent test results  History of Anesthesia Complications Negative for: history of anesthetic complications  Airway Mallampati: I  TM Distance: >3 FB Neck ROM: Full    Dental no notable dental hx.    Pulmonary neg pulmonary ROS, neg sleep apnea, neg COPD,    breath sounds clear to auscultation- rhonchi (-) wheezing      Cardiovascular Exercise Tolerance: Good (-) hypertension(-) CAD, (-) Past MI, (-) Cardiac Stents and (-) CABG  Rhythm:Regular Rate:Normal - Systolic murmurs and - Diastolic murmurs    Neuro/Psych  Headaches, PSYCHIATRIC DISORDERS Anxiety Depression    GI/Hepatic negative GI ROS, Neg liver ROS,   Endo/Other  negative endocrine ROSneg diabetes  Renal/GU negative Renal ROS     Musculoskeletal negative musculoskeletal ROS (+)   Abdominal (+) - obese,   Peds  Hematology negative hematology ROS (+)   Anesthesia Other Findings Past Medical History: No date: Allergy No date: Anxiety No date: Depression   Reproductive/Obstetrics                            Anesthesia Physical Anesthesia Plan  ASA: II  Anesthesia Plan: General   Post-op Pain Management:    Induction: Intravenous  PONV Risk Score and Plan: 2 and Propofol infusion  Airway Management Planned: Natural Airway  Additional Equipment:   Intra-op Plan:   Post-operative Plan:   Informed Consent: I have reviewed the patients History and Physical, chart, labs and discussed the procedure including the risks, benefits and alternatives for the proposed anesthesia with the patient or authorized representative who has indicated his/her understanding and acceptance.     Dental advisory given  Plan Discussed with: CRNA and Anesthesiologist  Anesthesia  Plan Comments:         Anesthesia Quick Evaluation

## 2020-02-02 NOTE — Anesthesia Postprocedure Evaluation (Signed)
Anesthesia Post Note  Patient: Heidi Gonzales  Procedure(s) Performed: COLONOSCOPY WITH PROPOFOL (N/A )  Patient location during evaluation: Endoscopy Anesthesia Type: General Level of consciousness: awake and alert and oriented Pain management: pain level controlled Vital Signs Assessment: post-procedure vital signs reviewed and stable Respiratory status: spontaneous breathing, nonlabored ventilation and respiratory function stable Cardiovascular status: blood pressure returned to baseline and stable Postop Assessment: no signs of nausea or vomiting Anesthetic complications: no     Last Vitals:  Vitals:   02/01/20 1010 02/01/20 1020  BP: 125/89 (!) 108/97  Pulse: 86 63  Resp: 14 15  Temp:    SpO2: 100% 100%    Last Pain:  Vitals:   02/01/20 1000  TempSrc: Temporal  PainSc:                  Heidi Gonzales

## 2020-02-24 ENCOUNTER — Telehealth: Payer: BC Managed Care – PPO | Admitting: Gastroenterology

## 2020-05-17 ENCOUNTER — Other Ambulatory Visit: Payer: Self-pay | Admitting: Family Medicine

## 2020-05-17 DIAGNOSIS — F419 Anxiety disorder, unspecified: Secondary | ICD-10-CM

## 2020-05-17 DIAGNOSIS — K5900 Constipation, unspecified: Secondary | ICD-10-CM

## 2020-05-17 DIAGNOSIS — G43009 Migraine without aura, not intractable, without status migrainosus: Secondary | ICD-10-CM

## 2020-05-17 NOTE — Telephone Encounter (Signed)
Pt called in to ask if provider Angelica Chessman would fill Rx for  amitriptyline (ELAVIL) 10 MG tablet  For her?   Pharmacy: [facility] [phone] - [place], [place] - [address]  [address] [place] [place] [address]  Phone:  [phone] Fax:  [phone]

## 2020-05-19 ENCOUNTER — Other Ambulatory Visit: Payer: Self-pay | Admitting: Family Medicine

## 2020-05-19 DIAGNOSIS — K5900 Constipation, unspecified: Secondary | ICD-10-CM

## 2020-05-19 DIAGNOSIS — F419 Anxiety disorder, unspecified: Secondary | ICD-10-CM

## 2020-05-19 DIAGNOSIS — G43009 Migraine without aura, not intractable, without status migrainosus: Secondary | ICD-10-CM

## 2020-05-19 MED ORDER — AMITRIPTYLINE HCL 10 MG PO TABS: 20.0000 mg | ORAL_TABLET | Freq: Every day | ORAL | 1 refills | Status: DC

## 2020-05-20 ENCOUNTER — Ambulatory Visit: Payer: Self-pay | Admitting: Family Medicine

## 2020-05-31 DIAGNOSIS — R102 Pelvic and perineal pain: Secondary | ICD-10-CM | POA: Diagnosis not present

## 2020-05-31 DIAGNOSIS — Z3041 Encounter for surveillance of contraceptive pills: Secondary | ICD-10-CM | POA: Diagnosis not present

## 2020-05-31 DIAGNOSIS — K5909 Other constipation: Secondary | ICD-10-CM | POA: Diagnosis not present

## 2020-06-06 DIAGNOSIS — R278 Other lack of coordination: Secondary | ICD-10-CM | POA: Diagnosis not present

## 2020-06-13 DIAGNOSIS — R278 Other lack of coordination: Secondary | ICD-10-CM | POA: Diagnosis not present

## 2020-11-08 ENCOUNTER — Encounter: Payer: Self-pay | Admitting: Medical

## 2020-11-08 ENCOUNTER — Ambulatory Visit: Payer: Self-pay | Admitting: Medical

## 2020-11-08 ENCOUNTER — Other Ambulatory Visit: Payer: Self-pay | Admitting: Family Medicine

## 2020-11-08 ENCOUNTER — Other Ambulatory Visit: Payer: Self-pay

## 2020-11-08 ENCOUNTER — Ambulatory Visit
Admission: RE | Admit: 2020-11-08 | Discharge: 2020-11-08 | Disposition: A | Discharge status: Home / Self Care | Payer: BC Managed Care – PPO | Attending: Medical | Admitting: Medical

## 2020-11-08 ENCOUNTER — Ambulatory Visit
Admission: RE | Admit: 2020-11-08 | Discharge: 2020-11-08 | Disposition: A | Discharge status: Home / Self Care | Payer: BC Managed Care – PPO | Source: Ambulatory Visit | Attending: Medical | Admitting: Medical

## 2020-11-08 VITALS — BP 130/80 | HR 71 | Temp 98.4°F | Resp 16 | Ht 66.0 in | Wt 129.8 lb

## 2020-11-08 DIAGNOSIS — M25532 Pain in left wrist: Secondary | ICD-10-CM | POA: Diagnosis not present

## 2020-11-08 DIAGNOSIS — F419 Anxiety disorder, unspecified: Secondary | ICD-10-CM

## 2020-11-08 DIAGNOSIS — K5900 Constipation, unspecified: Secondary | ICD-10-CM

## 2020-11-08 DIAGNOSIS — G43009 Migraine without aura, not intractable, without status migrainosus: Secondary | ICD-10-CM

## 2020-11-08 NOTE — Progress Notes (Addendum)
Called patient with x-ray results of left wrist within normal limits. Recommended one week of Motrin and if not improving to return to clinic.  If improving to continue Motrin x 10-14 days, then reevaluation if needed..   Subjective:    Patient ID: Heidi Gonzales, female    DOB: Nov 20, 1995, 25 y.o.   MRN: [id number]  HPI   25 yo female in non acute distress, has had pain in left wrist x 2 weeks. Was working on pole exercising and aldo did back exercises and triceps. After working out  Later in the afternoon , patient started having pain on dorsum of left wrist.  It hurts to extension and with flexion. Pain on turning door handles.   Tingling on the dorsum of the wrist suometimes. Denies swelling. Treated initially with ice.  Which did not seem to help much. Types a lot at work.  Blood pressure 130/80, pulse 71, temperature 98.4 F (36.9 C), temperature source Oral, resp. rate 16, height 5\' 6"  (1.676 m), weight 129 lb 12.8 oz (58.9 kg), last menstrual period 10/31/2020, SpO2 100 %.  Allergies  Allergen Reactions   Pollen Extract Itching     Current Outpatient Medications:    amitriptyline (ELAVIL) 10 MG tablet, Take 2 tablets (20 mg total) by mouth at bedtime., Disp: 180 tablet, Rfl: 1   Norgestimate-Ethinyl Estradiol Triphasic (TRI-LO-SPRINTEC) 0.18/0.215/0.25 MG-25 MCG tab, Take 1 tablet by mouth daily., Disp: , Rfl:  Review of Systems Right handed. No prior injury.    Objective:   Physical Exam Vitals and nursing note reviewed.  Constitutional:      Appearance: Normal appearance. She is normal weight.  HENT:     Head: Normocephalic and atraumatic.  Musculoskeletal:        General: Swelling (mild on dorsum of  thumb side) and tenderness present. No deformity or signs of injury.  Skin:    General: Skin is warm and dry.     Findings: No bruising, erythema or lesion.  Neurological:     Mental Status: She is alert.           Assessment & Plan:  Left wrist  pain. Orders Placed This Encounter  Procedures   DG Wrist Complete Left    Standing Status:   Future    Number of Occurrences:   1    Standing Expiration Date:   11/08/2021    Order Specific Question:   Reason for Exam (SYMPTOM  OR DIAGNOSIS REQUIRED)    Answer:   pain on dorsum of wrist , some tingling x 2 weeks.    Order Specific Question:   Is patient pregnant?    Answer:   No    Order Specific Question:   Preferred imaging location?    Answer:   01/06/2022    Order Specific Question:   Call Results- Best Contact Number?    Answer:   [phone]  OTC Motrin 800mg  three times a day with food x 7 days.  Velcro splinst placed on patient. Will do follow up in 2 weeks if x-ray is normal. Patient verbalizes understanding and has no questions at discharge.

## 2020-11-15 ENCOUNTER — Encounter: Payer: Self-pay | Admitting: Medical

## 2020-11-15 ENCOUNTER — Other Ambulatory Visit: Payer: Self-pay | Admitting: Medical

## 2020-11-15 DIAGNOSIS — M25532 Pain in left wrist: Secondary | ICD-10-CM

## 2020-11-15 NOTE — Progress Notes (Signed)
Patient concerned about left wrist pain, she has worn a brace and is doing OTC  For pain. Still feels painful today and would like a referral to Orthopedics. Referred to [facility] in [place]. Sent MyChart message to patient.

## 2020-11-21 DIAGNOSIS — M67834 Other specified disorders of tendon, left wrist: Secondary | ICD-10-CM | POA: Diagnosis not present

## 2021-02-08 ENCOUNTER — Other Ambulatory Visit: Payer: Self-pay | Admitting: Family Medicine

## 2021-02-08 DIAGNOSIS — F419 Anxiety disorder, unspecified: Secondary | ICD-10-CM

## 2021-02-08 DIAGNOSIS — K5900 Constipation, unspecified: Secondary | ICD-10-CM

## 2021-02-08 DIAGNOSIS — G43009 Migraine without aura, not intractable, without status migrainosus: Secondary | ICD-10-CM

## 2021-04-06 ENCOUNTER — Other Ambulatory Visit: Payer: Self-pay

## 2021-04-06 ENCOUNTER — Ambulatory Visit: Payer: Self-pay

## 2021-04-06 DIAGNOSIS — Z23 Encounter for immunization: Secondary | ICD-10-CM

## 2021-04-06 NOTE — Progress Notes (Signed)
Vaccination

## 2021-04-25 ENCOUNTER — Telehealth: Payer: Self-pay | Admitting: Family Medicine

## 2021-04-25 DIAGNOSIS — K5900 Constipation, unspecified: Secondary | ICD-10-CM

## 2021-04-25 DIAGNOSIS — G43009 Migraine without aura, not intractable, without status migrainosus: Secondary | ICD-10-CM

## 2021-04-25 DIAGNOSIS — F419 Anxiety disorder, unspecified: Secondary | ICD-10-CM

## 2021-04-26 ENCOUNTER — Telehealth: Payer: Self-pay

## 2021-04-26 NOTE — Telephone Encounter (Signed)
Pt is leaving on Sunday to go out of the country and ask for our office to call her if we get a cancellation for tomorrow

## 2021-04-26 NOTE — Telephone Encounter (Signed)
Patient had called front desk and asked if this office could refill a rx for Elavil 20 mg #180.  I reviewed this request with our provider, Ellie Lunch PA-C and she said that the patient would have to go back to her PCP or the provider who last prescribed this medication for her.   I called the patient and spoke to her and explained that the provider recommended that she contact her PCP for a refill. Patient understood and appreciated the return phone call.

## 2021-05-24 ENCOUNTER — Telehealth: Payer: BC Managed Care – PPO | Admitting: Family Medicine

## 2021-05-25 ENCOUNTER — Ambulatory Visit: Payer: BC Managed Care – PPO | Admitting: Family Medicine

## 2021-05-25 ENCOUNTER — Other Ambulatory Visit: Payer: Self-pay

## 2021-05-25 VITALS — BP 120/70 | HR 99 | Temp 98.0°F | Resp 16 | Ht 66.0 in | Wt 125.5 lb

## 2021-05-25 DIAGNOSIS — Z8349 Family history of other endocrine, nutritional and metabolic diseases: Secondary | ICD-10-CM

## 2021-05-25 DIAGNOSIS — K5900 Constipation, unspecified: Secondary | ICD-10-CM | POA: Diagnosis not present

## 2021-05-25 DIAGNOSIS — F419 Anxiety disorder, unspecified: Secondary | ICD-10-CM

## 2021-05-25 DIAGNOSIS — R102 Pelvic and perineal pain unspecified side: Secondary | ICD-10-CM

## 2021-05-25 DIAGNOSIS — G43009 Migraine without aura, not intractable, without status migrainosus: Secondary | ICD-10-CM

## 2021-05-25 DIAGNOSIS — Z3041 Encounter for surveillance of contraceptive pills: Secondary | ICD-10-CM | POA: Diagnosis not present

## 2021-05-25 LAB — POCT URINE PREGNANCY: Preg Test, Ur: NEGATIVE

## 2021-05-25 MED ORDER — NORETHINDRONE ACET-ETHINYL EST 1-20 MG-MCG PO TABS: 1.0000 | ORAL_TABLET | Freq: Every day | ORAL | 4 refills | Status: DC

## 2021-05-25 MED ORDER — AMITRIPTYLINE HCL 10 MG PO TABS: ORAL_TABLET | ORAL | 3 refills | Status: DC

## 2021-05-25 NOTE — Progress Notes (Signed)
Patient ID: Heidi Gonzales, female    DOB: [birth year], 25 y.o.   MRN: [id number]  PCP: Delsa Grana, PA-C  Chief Complaint  Patient presents with   Medication Refill   forms    Subjective:   Heidi Gonzales is a 25 y.o. female, presents to clinic with CC of the following:  HPI  Here for med refill, last OV was 16 months ago, est with clinic about 18 months ago, PCP left the area, hx of constipation and migraines   Migraines - managed with amitriptyline 20 mg dose nightly   GI issues/constipation Bloating - Pt did consult with GI, had colonscopy 02/01/2020 - normal Pelvic pain - has been seeing Dr. Leonides Schanz for pelvic pain, constipation- OBGYN managed OCP Prior labs reviewed - normal TSH and neg celiac panel  Prior OV reviewed: Pt presents to establish care.  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  Also pursuing Chaplaincy in the future.   Constipation/Bloating: She has had issues with constipation for about 2 years.  She thought is was an issue of anxiety and stress.  She exercises daily and has changed her diet without relief.  She notes that avoiding dairy and gluten works to decrease bloating and acne, but constipation is still significant.  She will often go 10-12 days without a bowel movement.  Does have occasional BRBPR, no dark and tarry stools.  Will have occasional nausea when she is very constipated.  She has taken laxative about once a month for the last several month.  Discussed medication options (Miralax, amitiza, loinzess) - she does not want to take medication at this time, would like to see GI to discuss.  Also recommend Low FodMap diet to determine.   Headaches: She has been having more intense headaches every week since October 2020. She questions if headaches are related to stress/diet, etc.  Mother, brother, and sister with history migraines.  Having about 8 migraine days in a month right now.   She endorses more anxiety and stress  lately with her job.  Has never been medicated for anxiety in the past, but has gone to counseling in the past.  Seeing a counselor online routinely. Typical Headache: Starts in temple/jaw unilaterally, then moves to the front of the head.  Will take ibuprofen and lay down.  Typically starts in the evening, will often wake up with headache the next morning.  She takes 272m ibuprofen and drinks some caffeine if needed which does tend to help with the symptoms.  She does this maybe once a week at most.  She describes her migraine episodes as having lack of focus, photosensitivity, sound sensitivity, and needing to lie down.    Reviewed OBGYN chart from a year ago, pt wants OCP refill, was not getting here but recently started it with Dr. WLeonides Schanz Ward, CJuliane Lack MD - 05/31/2020 10:15 AM EDT Formatting of this note is different from the original.  Chief Complaint:   Chief Complaint  Patient presents with   Menstrual Cramps   HPI: [name] a 25y.o. female here for Menstrual Cramps . She is an established patient who presents today for a new problem:    Location: Pelvis   Quality: Pain/Cramping  Severity: Moderate  Duration: Lifetime- worsening   Timing: Cyclic   Modifying factors: worse with menses and constipation and certain foods, better with amitriptyline   Associated signs and symptoms: +Constipation, +Migraines, +Bloating, regular cycles. +anxiety, +tired, +pain after intercourse  Context:   Amitriptyline for GI issues, anxiety and migraines: amitriptyline at 20 cause a "crash" at 6 pm  Cut dairy, egg whites, and many other things but still has bloating and constipation.  Completely changed lifestyle/eating habits  BM are 1 or 2 on bristol scale with black spots.   The past 2 months painful cramping with periods Cramps worsen from end of period and first week of cycle Also has become more anxious and moody the first and last week of cycle  Unsure when its a  gut problem versus gyn problem Pain after intercourse localized to one side or the other.   Prior Treatments and evaluations:  01/2020: Colonoscopy: Normal findings   Patient's last menstrual period was 05/16/2020.   Menarche: unknown  Period length: 7-10 days  Flow / pattern: moderate  Cycle length: 28 days  Last pap smear: 2021 neg/neg History of abnormal pap smears: no  Sexually active: yes  Current contraception: OCPs  History of sexually transmitted infections: no     Patient Active Problem List   Diagnosis Date Noted   Need for diphtheria-tetanus-pertussis (Tdap) vaccine 04/06/2021   Constipation 11/12/2019   Migraine without aura and without status migrainosus, not intractable 11/12/2019   Anxiety 11/12/2019      Current Outpatient Medications:    norethindrone-ethinyl estradiol (LOESTRIN) 1-20 MG-MCG tablet, Take 1 tablet by mouth daily. Continuously, Disp: 84 tablet, Rfl: 4   amitriptyline (ELAVIL) 10 MG tablet, Take 10 mg (1 tab) PO at bedtime daily, Disp: 90 tablet, Rfl: 3   Allergies  Allergen Reactions   Pollen Extract Itching     Social History   Tobacco Use   Smoking status: Never   Smokeless tobacco: Never  Vaping Use   Vaping Use: Never used  Substance Use Topics   Alcohol use: Yes   Drug use: Never      Chart Review Today: I personally reviewed active problem list, medication list, allergies, family history, social history, health maintenance, notes from last encounter, lab results, imaging with the patient/caregiver today.   Review of Systems  Constitutional: Negative.   HENT: Negative.    Eyes: Negative.   Respiratory: Negative.    Cardiovascular: Negative.   Gastrointestinal: Negative.   Endocrine: Negative.   Genitourinary: Negative.   Musculoskeletal: Negative.   Skin: Negative.   Allergic/Immunologic: Negative.   Neurological: Negative.   Hematological: Negative.   Psychiatric/Behavioral: Negative.    All other systems  reviewed and are negative.     Objective:   Vitals:   05/25/21 1458  BP: 120/70  Pulse: 99  Resp: 16  Temp: 98 F (36.7 C)  SpO2: 99%  Weight: 125 lb 8 oz (56.9 kg)  Height: '5\' 6"'  (1.676 m)    Body mass index is 20.26 kg/m.  Physical Exam Vitals and nursing note reviewed.  Constitutional:      General: She is not in acute distress.    Appearance: Normal appearance. She is well-developed. She is not ill-appearing, toxic-appearing or diaphoretic.     Interventions: Face mask in place.  HENT:     Head: Normocephalic and atraumatic.     Right Ear: External ear normal.     Left Ear: External ear normal.  Eyes:     General: Lids are normal. No scleral icterus.       Right eye: No discharge.        Left eye: No discharge.     Conjunctiva/sclera: Conjunctivae normal.  Neck:     Trachea:  Phonation normal. No tracheal deviation.  Cardiovascular:     Rate and Rhythm: Normal rate and regular rhythm.     Pulses: Normal pulses.          Radial pulses are 2+ on the right side and 2+ on the left side.       Posterior tibial pulses are 2+ on the right side and 2+ on the left side.     Heart sounds: Normal heart sounds. No murmur heard.   No friction rub. No gallop.  Pulmonary:     Effort: Pulmonary effort is normal. No respiratory distress.     Breath sounds: Normal breath sounds. No stridor. No wheezing, rhonchi or rales.  Chest:     Chest wall: No tenderness.  Abdominal:     General: Bowel sounds are normal. There is no distension.     Palpations: Abdomen is soft.     Tenderness: There is no abdominal tenderness. There is no right CVA tenderness, left CVA tenderness or guarding.  Musculoskeletal:     Right lower leg: No edema.     Left lower leg: No edema.  Skin:    General: Skin is warm and dry.     Coloration: Skin is not jaundiced or pale.     Findings: No rash.  Neurological:     Mental Status: She is alert.     Motor: No abnormal muscle tone.     Gait: Gait normal.   Psychiatric:        Attention and Perception: Attention normal.        Mood and Affect: Affect normal. Mood is anxious.        Speech: Speech normal.        Behavior: Behavior normal. Behavior is cooperative.     Results for orders placed or performed in visit on 05/25/21  COMPLETE METABOLIC PANEL WITH GFR  Result Value Ref Range   Glucose, Bld 85 65 - 99 mg/dL   BUN 7 7 - 25 mg/dL   Creat 0.67 0.50 - 0.96 mg/dL   eGFR 124 > OR = 60 mL/min/1.12m   BUN/Creatinine Ratio NOT APPLICABLE 6 - 22 (calc)   Sodium 139 135 - 146 mmol/L   Potassium 4.2 3.5 - 5.3 mmol/L   Chloride 102 98 - 110 mmol/L   CO2 30 20 - 32 mmol/L   Calcium 10.0 8.6 - 10.2 mg/dL   Total Protein 7.4 6.1 - 8.1 g/dL   Albumin 4.7 3.6 - 5.1 g/dL   Globulin 2.7 1.9 - 3.7 g/dL (calc)   AG Ratio 1.7 1.0 - 2.5 (calc)   Total Bilirubin 0.3 0.2 - 1.2 mg/dL   Alkaline phosphatase (APISO) 49 31 - 125 U/L   AST 22 10 - 30 U/L   ALT 35 (H) 6 - 29 U/L  TSH  Result Value Ref Range   TSH 1.05 mIU/L  Magnesium  Result Value Ref Range   Magnesium 2.1 1.5 - 2.5 mg/dL  T4, free  Result Value Ref Range   Free T4 1.0 0.8 - 1.8 ng/dL  POCT urine pregnancy  Result Value Ref Range   Preg Test, Ur Negative Negative       Assessment & Plan:     ICD-10-CM   1. Migraine without aura and without status migrainosus, not intractable  G43.009 amitriptyline (ELAVIL) 10 MG tablet    norethindrone-ethinyl estradiol (LOESTRIN) 1-20 MG-MCG tablet    2. Constipation, unspecified constipation type  K59.00 amitriptyline (ELAVIL) 10 MG tablet  COMPLETE METABOLIC PANEL WITH GFR    TSH    Magnesium    T4, free   continue to work on diet efforts, referrals offered     3. Anxiety  F41.9 amitriptyline (ELAVIL) 10 MG tablet   encouraged consulting therapist, reviewed somatic sx that are common, continue coping skills and meds     4. Family history of thyroid disease in mother  N01.48 COMPLETE METABOLIC PANEL WITH GFR    TSH     Magnesium    T4, free    5. Pelvic pain  R10.2 norethindrone-ethinyl estradiol (LOESTRIN) 1-20 MG-MCG tablet   she follows with specialists - encouraged advocating for herself with gyn if pain worsens    6. Encounter for surveillance of contraceptive pills  Z30.41 norethindrone-ethinyl estradiol (LOESTRIN) 1-20 MG-MCG tablet    POCT Pregnancy, Urine    POCT urine pregnancy      Return in about 6 months (around 11/25/2021) for virtual - OCPmigraine  f/up .     Delsa Grana, PA-C 05/25/21 3:27 PM

## 2021-05-25 NOTE — Patient Instructions (Signed)

## 2021-05-26 LAB — COMPLETE METABOLIC PANEL WITH GFR
AG Ratio: 1.7 (calc) (ref 1.0–2.5)
ALT: 35 U/L — ABNORMAL HIGH (ref 6–29)
AST: 22 U/L (ref 10–30)
Albumin: 4.7 g/dL (ref 3.6–5.1)
Alkaline phosphatase (APISO): 49 U/L (ref 31–125)
BUN: 7 mg/dL (ref 7–25)
CO2: 30 mmol/L (ref 20–32)
Calcium: 10 mg/dL (ref 8.6–10.2)
Chloride: 102 mmol/L (ref 98–110)
Creat: 0.67 mg/dL (ref 0.50–0.96)
Globulin: 2.7 g/dL (calc) (ref 1.9–3.7)
Glucose, Bld: 85 mg/dL (ref 65–99)
Potassium: 4.2 mmol/L (ref 3.5–5.3)
Sodium: 139 mmol/L (ref 135–146)
Total Bilirubin: 0.3 mg/dL (ref 0.2–1.2)
Total Protein: 7.4 g/dL (ref 6.1–8.1)
eGFR: 124 mL/min/{1.73_m2} (ref >=60)

## 2021-05-26 LAB — TSH: TSH: 1.05 mIU/L

## 2021-05-26 LAB — T4, FREE: Free T4: 1 ng/dL (ref 0.8–1.8)

## 2021-05-26 LAB — MAGNESIUM: Magnesium: 2.1 mg/dL (ref 1.5–2.5)

## 2021-06-05 ENCOUNTER — Telehealth: Payer: Self-pay

## 2021-06-05 NOTE — Telephone Encounter (Signed)
Not to my knowledge.

## 2021-06-05 NOTE — Telephone Encounter (Signed)
Copied from CRM [id number]. Topic: General - Other >> Jun 05, 2021 10:42 AM Leafy Ro wrote: Reason for CRM: Pt is calling to see if office has received her immunizations record from dr Thomasena Edis with [facility]  [place]   for Heidi Gonzales to complete the form for graduate school

## 2021-06-05 NOTE — Telephone Encounter (Signed)
Have yall received any records?

## 2021-06-05 NOTE — Telephone Encounter (Signed)
Called left vm have not seen records as of yet?

## 2021-06-06 ENCOUNTER — Encounter: Payer: Self-pay | Admitting: Family Medicine

## 2021-06-12 ENCOUNTER — Encounter: Payer: Self-pay | Admitting: Family Medicine

## 2021-06-13 NOTE — Telephone Encounter (Signed)
Hey, her shot records came this morning

## 2021-06-15 ENCOUNTER — Telehealth: Payer: Self-pay

## 2021-06-15 NOTE — Telephone Encounter (Signed)
Copied from CRM [id number]. 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 For additional concerns when possible

## 2021-06-15 NOTE — Telephone Encounter (Signed)
Form mailed

## 2021-08-18 ENCOUNTER — Other Ambulatory Visit: Payer: Self-pay

## 2021-08-18 ENCOUNTER — Encounter: Payer: Self-pay | Admitting: Family Medicine

## 2021-08-18 DIAGNOSIS — Z3041 Encounter for surveillance of contraceptive pills: Secondary | ICD-10-CM

## 2021-08-18 DIAGNOSIS — K5900 Constipation, unspecified: Secondary | ICD-10-CM

## 2021-08-18 DIAGNOSIS — F419 Anxiety disorder, unspecified: Secondary | ICD-10-CM

## 2021-08-18 DIAGNOSIS — G43009 Migraine without aura, not intractable, without status migrainosus: Secondary | ICD-10-CM

## 2021-08-18 DIAGNOSIS — R102 Pelvic and perineal pain: Secondary | ICD-10-CM

## 2021-08-21 ENCOUNTER — Other Ambulatory Visit: Payer: Self-pay

## 2021-08-21 DIAGNOSIS — R102 Pelvic and perineal pain: Secondary | ICD-10-CM

## 2021-08-21 DIAGNOSIS — Z3041 Encounter for surveillance of contraceptive pills: Secondary | ICD-10-CM

## 2021-08-21 DIAGNOSIS — K5900 Constipation, unspecified: Secondary | ICD-10-CM

## 2021-08-21 DIAGNOSIS — F419 Anxiety disorder, unspecified: Secondary | ICD-10-CM

## 2021-08-21 DIAGNOSIS — G43009 Migraine without aura, not intractable, without status migrainosus: Secondary | ICD-10-CM

## 2021-08-21 MED ORDER — NORETHINDRONE ACET-ETHINYL EST 1-20 MG-MCG PO TABS: 1.0000 | ORAL_TABLET | Freq: Every day | ORAL | 4 refills | Status: AC

## 2021-08-21 MED ORDER — AMITRIPTYLINE HCL 10 MG PO TABS: ORAL_TABLET | ORAL | 3 refills | Status: AC

## 2022-08-22 ENCOUNTER — Other Ambulatory Visit: Payer: Self-pay | Admitting: Family Medicine

## 2022-08-22 DIAGNOSIS — K5900 Constipation, unspecified: Secondary | ICD-10-CM

## 2022-08-22 DIAGNOSIS — F419 Anxiety disorder, unspecified: Secondary | ICD-10-CM

## 2022-08-22 DIAGNOSIS — G43009 Migraine without aura, not intractable, without status migrainosus: Secondary | ICD-10-CM

## 2022-12-03 IMAGING — CR DG WRIST COMPLETE 3+V*L*
1 series · 4 of 4 positions shown · non-contrast
Comparison: None.

CLINICAL DATA: Left wrist pain and tingling since lifting weights 2
weeks ago.

EXAM:
LEFT WRIST - COMPLETE 3+ VIEW

[Series 1: dg wrist complete left · 0.14mm/px · 4 of 4 slices shown]
[im 1/4]
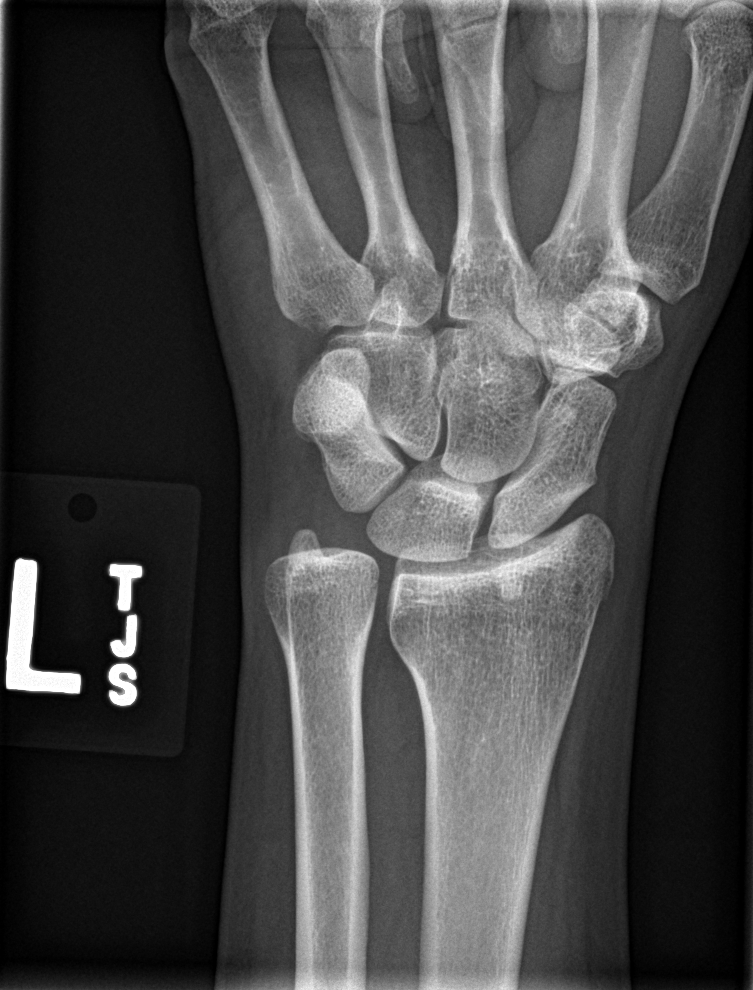
[im 2/4]
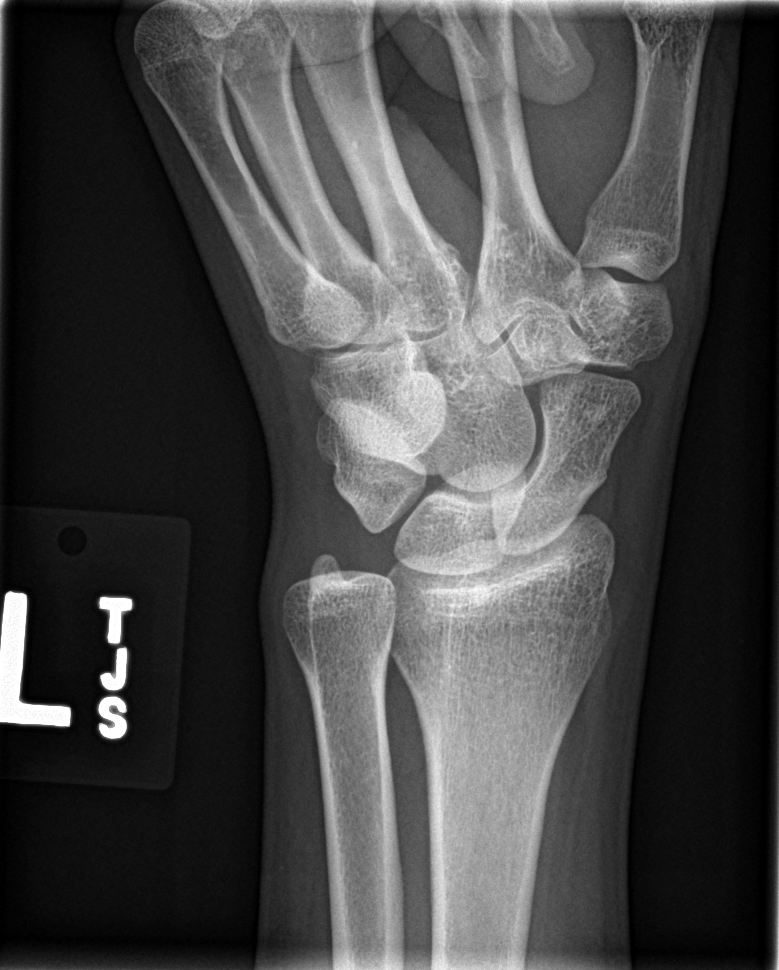
[im 3/4]
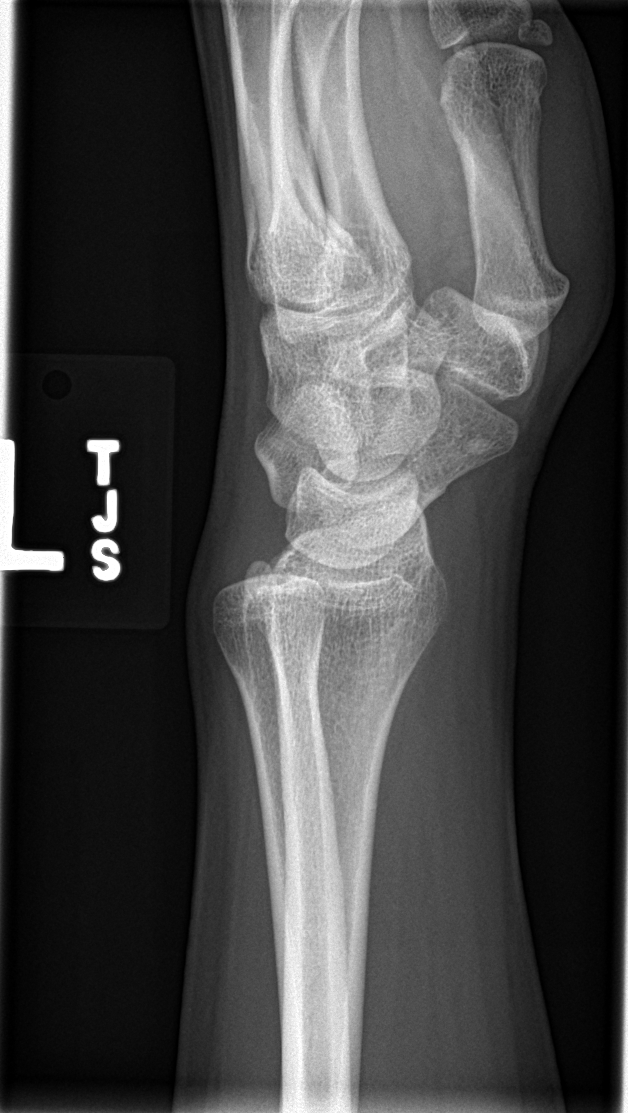
[im 4/4]
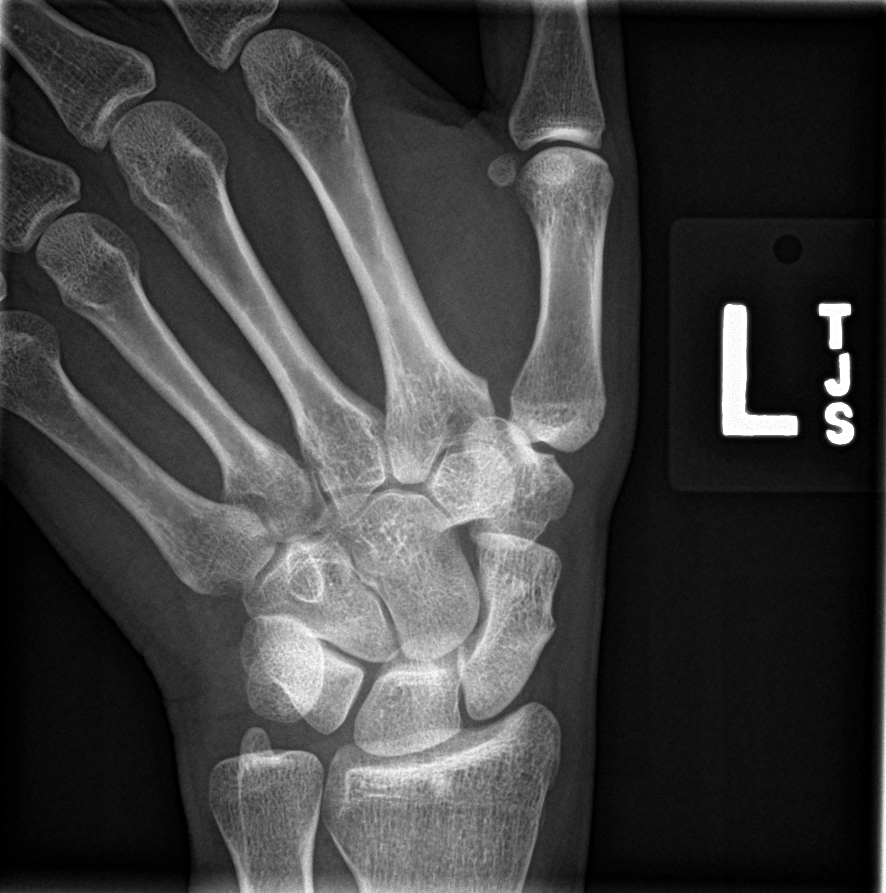

[4 of 4 positions shown; findings below may reference images not displayed]

FINDINGS: There is no evidence of fracture or dislocation. There is no
evidence of arthropathy or other focal bone abnormality. Soft
tissues are unremarkable.
IMPRESSION: Negative exam.
